# Patient Record
Sex: Female | Born: 1965 | Race: White | Hispanic: No | Marital: Married | Smoking: Former smoker
Health system: Southern US, Community
[De-identification: ages and names within clinical notes are randomized; demographics above are authoritative.]

## PROBLEM LIST (undated history)

## (undated) DIAGNOSIS — I2699 Other pulmonary embolism without acute cor pulmonale: Secondary | ICD-10-CM

## (undated) DIAGNOSIS — K219 Gastro-esophageal reflux disease without esophagitis: Secondary | ICD-10-CM

## (undated) DIAGNOSIS — E7800 Pure hypercholesterolemia, unspecified: Secondary | ICD-10-CM

## (undated) HISTORY — PX: CHOLECYSTECTOMY: SHX55

## (undated) HISTORY — PX: ABDOMINAL HYSTERECTOMY: SHX81

## (undated) HISTORY — PX: KNEE ARTHROPLASTY: SHX992

---

## 2015-10-11 DIAGNOSIS — K8020 Calculus of gallbladder without cholecystitis without obstruction: Secondary | ICD-10-CM | POA: Insufficient documentation

## 2015-10-11 DIAGNOSIS — R109 Unspecified abdominal pain: Secondary | ICD-10-CM | POA: Insufficient documentation

## 2015-10-11 HISTORY — DX: Calculus of gallbladder without cholecystitis without obstruction: K80.20

## 2015-10-11 HISTORY — DX: Unspecified abdominal pain: R10.9

## 2016-07-26 DIAGNOSIS — I2699 Other pulmonary embolism without acute cor pulmonale: Secondary | ICD-10-CM | POA: Diagnosis not present

## 2016-07-27 DIAGNOSIS — I2699 Other pulmonary embolism without acute cor pulmonale: Secondary | ICD-10-CM | POA: Diagnosis not present

## 2016-08-12 DIAGNOSIS — D509 Iron deficiency anemia, unspecified: Secondary | ICD-10-CM

## 2016-08-12 DIAGNOSIS — R7303 Prediabetes: Secondary | ICD-10-CM

## 2016-08-12 DIAGNOSIS — K219 Gastro-esophageal reflux disease without esophagitis: Secondary | ICD-10-CM | POA: Diagnosis present

## 2016-08-12 DIAGNOSIS — K449 Diaphragmatic hernia without obstruction or gangrene: Secondary | ICD-10-CM

## 2016-08-12 HISTORY — DX: Diaphragmatic hernia without obstruction or gangrene: K44.9

## 2016-08-12 HISTORY — DX: Iron deficiency anemia, unspecified: D50.9

## 2016-08-12 HISTORY — DX: Prediabetes: R73.03

## 2016-08-13 DIAGNOSIS — Z79899 Other long term (current) drug therapy: Secondary | ICD-10-CM

## 2016-08-13 DIAGNOSIS — E669 Obesity, unspecified: Secondary | ICD-10-CM | POA: Insufficient documentation

## 2016-08-13 DIAGNOSIS — R5381 Other malaise: Secondary | ICD-10-CM

## 2016-08-13 DIAGNOSIS — R5383 Other fatigue: Secondary | ICD-10-CM

## 2016-08-13 HISTORY — DX: Other long term (current) drug therapy: Z79.899

## 2016-08-13 HISTORY — DX: Other malaise: R53.81

## 2016-08-13 HISTORY — DX: Obesity, unspecified: E66.9

## 2016-08-13 HISTORY — DX: Other malaise: R53.83

## 2016-08-21 DIAGNOSIS — E538 Deficiency of other specified B group vitamins: Secondary | ICD-10-CM

## 2016-08-21 HISTORY — DX: Deficiency of other specified B group vitamins: E53.8

## 2016-09-27 DIAGNOSIS — I700 Atherosclerosis of aorta: Secondary | ICD-10-CM

## 2016-09-27 HISTORY — DX: Atherosclerosis of aorta: I70.0

## 2016-12-24 DIAGNOSIS — E785 Hyperlipidemia, unspecified: Secondary | ICD-10-CM

## 2016-12-24 HISTORY — DX: Hyperlipidemia, unspecified: E78.5

## 2019-08-26 DIAGNOSIS — S83241A Other tear of medial meniscus, current injury, right knee, initial encounter: Secondary | ICD-10-CM

## 2019-08-26 HISTORY — DX: Other tear of medial meniscus, current injury, right knee, initial encounter: S83.241A

## 2019-09-16 DIAGNOSIS — M1711 Unilateral primary osteoarthritis, right knee: Secondary | ICD-10-CM | POA: Insufficient documentation

## 2019-09-16 HISTORY — DX: Unilateral primary osteoarthritis, right knee: M17.11

## 2019-10-24 DIAGNOSIS — M47816 Spondylosis without myelopathy or radiculopathy, lumbar region: Secondary | ICD-10-CM

## 2019-10-24 DIAGNOSIS — M51369 Other intervertebral disc degeneration, lumbar region without mention of lumbar back pain or lower extremity pain: Secondary | ICD-10-CM

## 2019-10-24 DIAGNOSIS — M5136 Other intervertebral disc degeneration, lumbar region: Secondary | ICD-10-CM

## 2019-10-24 HISTORY — DX: Other intervertebral disc degeneration, lumbar region: M51.36

## 2019-10-24 HISTORY — DX: Spondylosis without myelopathy or radiculopathy, lumbar region: M47.816

## 2019-10-24 HISTORY — DX: Other intervertebral disc degeneration, lumbar region without mention of lumbar back pain or lower extremity pain: M51.369

## 2019-11-25 ENCOUNTER — Inpatient Hospital Stay (HOSPITAL_COMMUNITY)
Admission: AD | Admit: 2019-11-25 | Discharge: 2019-11-27 | DRG: 175 | Disposition: A | Discharge status: Home / Self Care | Payer: Commercial Managed Care - PPO | Source: Other Acute Inpatient Hospital | Attending: Internal Medicine | Admitting: Internal Medicine

## 2019-11-25 ENCOUNTER — Encounter (HOSPITAL_COMMUNITY): Payer: Self-pay | Admitting: Internal Medicine

## 2019-11-25 ENCOUNTER — Other Ambulatory Visit: Payer: Self-pay

## 2019-11-25 DIAGNOSIS — Z87891 Personal history of nicotine dependence: Secondary | ICD-10-CM

## 2019-11-25 DIAGNOSIS — G8929 Other chronic pain: Secondary | ICD-10-CM

## 2019-11-25 DIAGNOSIS — Z79899 Other long term (current) drug therapy: Secondary | ICD-10-CM

## 2019-11-25 DIAGNOSIS — J9601 Acute respiratory failure with hypoxia: Secondary | ICD-10-CM | POA: Diagnosis present

## 2019-11-25 DIAGNOSIS — Z7982 Long term (current) use of aspirin: Secondary | ICD-10-CM | POA: Diagnosis not present

## 2019-11-25 DIAGNOSIS — E7800 Pure hypercholesterolemia, unspecified: Secondary | ICD-10-CM | POA: Diagnosis present

## 2019-11-25 DIAGNOSIS — R7303 Prediabetes: Secondary | ICD-10-CM | POA: Diagnosis present

## 2019-11-25 DIAGNOSIS — Z86711 Personal history of pulmonary embolism: Secondary | ICD-10-CM

## 2019-11-25 DIAGNOSIS — I2699 Other pulmonary embolism without acute cor pulmonale: Principal | ICD-10-CM | POA: Diagnosis present

## 2019-11-25 DIAGNOSIS — Z20822 Contact with and (suspected) exposure to covid-19: Secondary | ICD-10-CM | POA: Diagnosis present

## 2019-11-25 DIAGNOSIS — K219 Gastro-esophageal reflux disease without esophagitis: Secondary | ICD-10-CM | POA: Diagnosis present

## 2019-11-25 DIAGNOSIS — I361 Nonrheumatic tricuspid (valve) insufficiency: Secondary | ICD-10-CM | POA: Diagnosis not present

## 2019-11-25 DIAGNOSIS — E785 Hyperlipidemia, unspecified: Secondary | ICD-10-CM | POA: Diagnosis not present

## 2019-11-25 DIAGNOSIS — Z90710 Acquired absence of both cervix and uterus: Secondary | ICD-10-CM

## 2019-11-25 DIAGNOSIS — Z96659 Presence of unspecified artificial knee joint: Secondary | ICD-10-CM | POA: Diagnosis present

## 2019-11-25 DIAGNOSIS — I2609 Other pulmonary embolism with acute cor pulmonale: Secondary | ICD-10-CM | POA: Diagnosis not present

## 2019-11-25 HISTORY — DX: Morbid (severe) obesity due to excess calories: E66.01

## 2019-11-25 HISTORY — DX: Pure hypercholesterolemia, unspecified: E78.00

## 2019-11-25 HISTORY — DX: Gastro-esophageal reflux disease without esophagitis: K21.9

## 2019-11-25 HISTORY — DX: Other chronic pain: G89.29

## 2019-11-25 HISTORY — DX: Other pulmonary embolism without acute cor pulmonale: I26.99

## 2019-11-25 LAB — HIV ANTIBODY (ROUTINE TESTING W REFLEX): HIV Screen 4th Generation wRfx: NONREACTIVE

## 2019-11-25 LAB — MRSA PCR SCREENING: MRSA by PCR: NEGATIVE

## 2019-11-25 LAB — HEPARIN LEVEL (UNFRACTIONATED): Heparin Unfractionated: 0.6 IU/mL (ref 0.30–0.70)

## 2019-11-25 MED ORDER — HEPARIN (PORCINE) 25000 UT/250ML-% IV SOLN
1400.0000 [IU]/h | INTRAVENOUS | Status: DC
  Administered 2019-11-26 – 2019-11-27 (×2): 1400 [IU]/h via INTRAVENOUS
  Filled 2019-11-25 (×2): qty 250

## 2019-11-25 MED ORDER — MORPHINE SULFATE (PF) 2 MG/ML IV SOLN: 2.0000 mg | INTRAVENOUS | Status: DC | PRN

## 2019-11-25 MED ORDER — ONDANSETRON HCL 4 MG PO TABS: 4.0000 mg | ORAL_TABLET | Freq: Four times a day (QID) | ORAL | Status: DC | PRN

## 2019-11-25 MED ORDER — SODIUM CHLORIDE 0.9 % IV SOLN: INTRAVENOUS | Status: DC

## 2019-11-25 MED ORDER — ONDANSETRON HCL 4 MG/2ML IJ SOLN: 4.0000 mg | Freq: Four times a day (QID) | INTRAMUSCULAR | Status: DC | PRN

## 2019-11-25 NOTE — Progress Notes (Addendum)
ANTICOAGULATION CONSULT NOTE  Pharmacy Consult for heparin Indication: pulmonary embolus  Heparin Dosing Weight: 88.3 kg  Labs: No results for input(s): HGB, HCT, PLT, APTT, LABPROT, INR, HEPARINUNFRC, HEPRLOWMOCWT, CREATININE, CKTOTAL, CKMB, TROPONINI in the last 72 hours.  Assessment: 27 yof presenting to [facility] with SOB since ~4 days after her R knee arthroscopic surgery on 2/11, found to have acute PE. Patient does have a history of PEs and was on anticoagulation; however, she stated at her office visit today that her doctor took her off years ago. Patient is not currently on anticoagulation PTA. Pharmacy consulted to dose heparin. Labs pending on admit. No active bleed issues reported.  Patient transferred from [facility] on heparin drip at 1400 units/hr. Received 5000 unit bolus and drip was started at 1332 at [place] per discussion with RN.   Goal of Therapy:  Heparin level 0.3-0.7 units/ml Monitor platelets by anticoagulation protocol: Yes   Plan:  Check Stat heparin level and adjust rate as appropriate Monitor daily CBC, s/sx bleeding   Babs Bertin, PharmD, BCPS Clinical Pharmacist 11/25/2019 8:23 PM    ADDENDUM - Initial heparin level therapeutic at 0.6 on heparin at 1400 units/hr. Will continue at current rate and f/u confirmatory heparin level in 6 hours.   Babs Bertin, PharmD, BCPS Please check AMION for all [facility] Pharmacy contact numbers Clinical Pharmacist 11/25/2019 10:28 PM

## 2019-11-25 NOTE — H&P (Signed)
History and Physical   Meagan Doyle NWG:[id number] DOB: May 02, 1966 DOA: 11/25/2019  Referring MD/NP/PA: From [facility]  PCP: No primary care provider on file.    Patient coming from: [facility]  Chief Complaint: Shortness of breath  HPI: Meagan Doyle is a 54 y.o. female with medical history significant of previous pulmonary embolism as a result of orthopedics surgery about 5 years ago, GERD, hyperlipidemia, prediabetes, morbid obesity, who recently had right knee arthroscopy on February 11 and presented at [facility] yesterday with shortness of breath and cough.  She initially went to her PCP who noted the oxygen sat of 89% on room air.  She has completed anticoagulation for 6 months 5 years ago and has not been on any.  Patient did not have any hypercoagulable work-up afterwards.  She has been fine until now.  But in the past 2 weeks she has dyspnea on exertion bilateral lower extremity edema for several days.  In the last 2 days she has been more short of breath with any minimal activity.  She was seen and evaluated at [facility].  Patient found to have bilateral PE with right heart strain.  Suspected submassive.  Transferred here for evaluation.  She was started on heparin.  She is hemodynamically stable.  Lab appears to be stable.  EKG showed right axis deviation and troponin was 0.11 patient therefore transferred here for work-up..  Review of Systems: As per HPI otherwise 10 point review of systems negative.    Past Medical History:  Diagnosis Date  . GERD (gastroesophageal reflux disease)   . High blood cholesterol   . Pulmonary embolism [facility])     Past Surgical History:  Procedure Laterality Date  . ABDOMINAL HYSTERECTOMY    . CHOLECYSTECTOMY    . KNEE ARTHROPLASTY       reports that she has quit smoking. Her smoking use included cigarettes. She quit after 15.00 years of use. She has never used smokeless tobacco. She reports previous alcohol use.  She reports that she does not use drugs.  No Known Allergies  History reviewed. No pertinent family history.   Prior to Admission medications   Not on File    Physical Exam: Vitals:   11/25/19 1945 11/25/19 2328  BP: (!) 144/87 131/78  Pulse: 81 89  Resp: 19 16  Temp: 98 F (36.7 C) 97.6 F (36.4 C)  TempSrc: Oral Oral  SpO2: 97% 98%  Weight: 108 kg   Height: 5\' 8"  (1.727 m)       Constitutional: Anxious no acute distress Vitals:   11/25/19 1945 11/25/19 2328  BP: (!) 144/87 131/78  Pulse: 81 89  Resp: 19 16  Temp: 98 F (36.7 C) 97.6 F (36.4 C)  TempSrc: Oral Oral  SpO2: 97% 98%  Weight: 108 kg   Height: 5\' 8"  (1.727 m)    Eyes: PERRL, lids and conjunctivae normal ENMT: Mucous membranes are moist. Posterior pharynx clear of any exudate or lesions.Normal dentition.  Neck: normal, supple, no masses, no thyromegaly Respiratory: Coarse breath sounds, clear to auscultation bilaterally, no wheezing, no crackles. Normal respiratory effort. No accessory muscle use.  Cardiovascular: Regular rate and rhythm, no murmurs / rubs / gallops. No extremity edema. 2+ pedal pulses. No carotid bruits.  Abdomen: no tenderness, no masses palpated. No hepatosplenomegaly. Bowel sounds positive.  Musculoskeletal: no clubbing / cyanosis. No joint deformity upper and lower extremities. Good ROM, no contractures. Normal muscle tone.  Skin: no rashes, lesions, ulcers. No induration Neurologic:  CN 2-12 grossly intact. Sensation intact, DTR normal. Strength 5/5 in all 4.  Psychiatric: Normal judgment and insight. Alert and oriented x 3. Normal mood.     Labs on Admission: I have personally reviewed following labs and imaging studies  CBC: No results for input(s): WBC, NEUTROABS, HGB, HCT, MCV, PLT in the last 168 hours. Basic Metabolic Panel: No results for input(s): NA, K, CL, CO2, GLUCOSE, BUN, CREATININE, CALCIUM, MG, PHOS in the last 168 hours. GFR: CrCl cannot be calculated (No  successful lab value found.). Liver Function Tests: No results for input(s): AST, ALT, ALKPHOS, BILITOT, PROT, ALBUMIN in the last 168 hours. No results for input(s): LIPASE, AMYLASE in the last 168 hours. No results for input(s): AMMONIA in the last 168 hours. Coagulation Profile: No results for input(s): INR, PROTIME in the last 168 hours. Cardiac Enzymes: No results for input(s): CKTOTAL, CKMB, CKMBINDEX, TROPONINI in the last 168 hours. BNP (last 3 results) No results for input(s): PROBNP in the last 8760 hours. HbA1C: No results for input(s): HGBA1C in the last 72 hours. CBG: No results for input(s): GLUCAP in the last 168 hours. Lipid Profile: No results for input(s): CHOL, HDL, LDLCALC, TRIG, CHOLHDL, LDLDIRECT in the last 72 hours. Thyroid Function Tests: No results for input(s): TSH, T4TOTAL, FREET4, T3FREE, THYROIDAB in the last 72 hours. Anemia Panel: No results for input(s): VITAMINB12, FOLATE, FERRITIN, TIBC, IRON, RETICCTPCT in the last 72 hours. Urine analysis: No results found for: COLORURINE, APPEARANCEUR, LABSPEC, PHURINE, GLUCOSEU, HGBUR, BILIRUBINUR, KETONESUR, PROTEINUR, UROBILINOGEN, NITRITE, LEUKOCYTESUR Sepsis Labs: @LABRCNTIP (procalcitonin:4,lacticidven:4) ) Recent Results (from the past 240 hour(s))  MRSA PCR Screening     Status: None   Collection Time: 11/25/19  7:40 PM   Specimen: Nasal Mucosa; Nasopharyngeal  Result Value Ref Range Status   MRSA by PCR NEGATIVE NEGATIVE Final    Comment:        The GeneXpert MRSA Assay (FDA approved for NASAL specimens only), is one component of a comprehensive MRSA colonization surveillance program. It is not intended to diagnose MRSA infection nor to guide or monitor treatment for MRSA infections. Performed at [facility], [address], [place], [place]      Radiological Exams on Admission: No results found.  EKG: Independently reviewed.  Sinus rhythm with no significant  changes  Assessment/Plan Principal Problem:   Pulmonary emboli (HCC) Active Problems:   Hyperlipemia   Chronic pain   GERD (gastroesophageal reflux disease)   Morbid obesity (HCC)   Pulmonary embolism and infarction (HCC)     #1 submassive pulmonary embolism with right heart strain: Patient currently on heparin.  Bedrest.  Stat echocardiogram in the morning.  May require vascular consultation if right heart strain persist.  She might be a candidate for embolectomy.  Counseling provided.  Patient most likely will remain on anticoagulation for the rest of her life.  She has taken Xarelto before.  2 hyperlipidemia: Currently on no medications at home.  May not be started on another 1.  #3 GERD: May require PPIs.  #4 morbid obesity: Dietary counseling.  #5 chronic pain: Not on any medication at the moment.   DVT prophylaxis: Heparin drip Code Status: Full code Family Communication: Discussed care with patient Disposition Plan: Home Consults called: None at the moment Admission status: Inpatient  Severity of Illness: The appropriate patient status for this patient is INPATIENT. Inpatient status is judged to be reasonable and necessary in order to provide the required intensity of service to ensure  the patient's safety. The patient's presenting symptoms, physical exam findings, and initial radiographic and laboratory data in the context of their chronic comorbidities is felt to place them at high risk for further clinical deterioration. Furthermore, it is not anticipated that the patient will be medically stable for discharge from the hospital within 2 midnights of admission. The following factors support the patient status of inpatient.   " The patient's presenting symptoms include shortness of breath. " The worrisome physical exam findings include no significant finding on exam. " The initial radiographic and laboratory data are worrisome because of CT findings of submassive  PE. " The chronic co-morbidities include previous PE.   * I certify that at the point of admission it is my clinical judgment that the patient will require inpatient hospital care spanning beyond 2 midnights from the point of admission due to high intensity of service, high risk for further deterioration and high frequency of surveillance required.Barbette Merino MD [facility] Pager [phone]  If 7PM-7AM, please contact night-coverage [email or web address] Password [facility]  11/26/2019, 12:36 AM

## 2019-11-26 ENCOUNTER — Inpatient Hospital Stay (HOSPITAL_COMMUNITY): Payer: Commercial Managed Care - PPO

## 2019-11-26 ENCOUNTER — Encounter (HOSPITAL_COMMUNITY): Payer: Self-pay | Admitting: Internal Medicine

## 2019-11-26 DIAGNOSIS — I2609 Other pulmonary embolism with acute cor pulmonale: Secondary | ICD-10-CM

## 2019-11-26 DIAGNOSIS — I2699 Other pulmonary embolism without acute cor pulmonale: Secondary | ICD-10-CM

## 2019-11-26 LAB — CBC
HCT: 36.6 % (ref 36.0–46.0)
Hemoglobin: 11.8 g/dL — ABNORMAL LOW (ref 12.0–15.0)
MCH: 28.6 pg (ref 26.0–34.0)
MCHC: 32.2 g/dL (ref 30.0–36.0)
MCV: 88.6 fL (ref 80.0–100.0)
Platelets: 206 10*3/uL (ref 150–400)
RBC: 4.13 MIL/uL (ref 3.87–5.11)
RDW: 14.1 % (ref 11.5–15.5)
WBC: 4.4 10*3/uL (ref 4.0–10.5)
nRBC: 0 % (ref 0.0–0.2)

## 2019-11-26 LAB — COMPREHENSIVE METABOLIC PANEL
ALT: 14 U/L (ref 0–44)
AST: 13 U/L — ABNORMAL LOW (ref 15–41)
Albumin: 3.4 g/dL — ABNORMAL LOW (ref 3.5–5.0)
Alkaline Phosphatase: 91 U/L (ref 38–126)
Anion gap: 9 (ref 5–15)
BUN: 7 mg/dL (ref 6–20)
CO2: 26 mmol/L (ref 22–32)
Calcium: 8.5 mg/dL — ABNORMAL LOW (ref 8.9–10.3)
Chloride: 106 mmol/L (ref 98–111)
Creatinine, Ser: 0.64 mg/dL (ref 0.44–1.00)
GFR calc Af Amer: >60 mL/min (ref >60)
GFR calc non Af Amer: >60 mL/min (ref >60)
Glucose, Bld: 95 mg/dL (ref 70–99)
Potassium: 3 mmol/L — ABNORMAL LOW (ref 3.5–5.1)
Sodium: 141 mmol/L (ref 135–145)
Total Bilirubin: 1 mg/dL (ref 0.3–1.2)
Total Protein: 6 g/dL — ABNORMAL LOW (ref 6.5–8.1)

## 2019-11-26 LAB — HEPARIN LEVEL (UNFRACTIONATED): Heparin Unfractionated: 0.45 IU/mL (ref 0.30–0.70)

## 2019-11-26 MED ORDER — ORAL CARE MOUTH RINSE
15.0000 mL | Freq: Two times a day (BID) | OROMUCOSAL | Status: DC
  Administered 2019-11-26: 15 mL via OROMUCOSAL

## 2019-11-26 MED ORDER — ACETAMINOPHEN 325 MG PO TABS
650.0000 mg | ORAL_TABLET | Freq: Four times a day (QID) | ORAL | Status: DC | PRN
  Administered 2019-11-26 (×2): 650 mg via ORAL
  Filled 2019-11-26 (×2): qty 2

## 2019-11-26 MED ORDER — POTASSIUM CHLORIDE CRYS ER 20 MEQ PO TBCR
40.0000 meq | EXTENDED_RELEASE_TABLET | ORAL | Status: AC
  Administered 2019-11-26 (×2): 40 meq via ORAL
  Filled 2019-11-26 (×2): qty 2

## 2019-11-26 NOTE — Progress Notes (Addendum)
PROGRESS NOTE    Meagan Doyle  ZGY:[id number] DOB: 1966/06/21 DOA: 11/25/2019 PCP: No primary care provider on file.     Brief Narrative:  Meagan Doyle is a 54 y.o. female with medical history significant of previous pulmonary embolism as a result of orthopedics surgery about 5 years ago, GERD, hyperlipidemia, prediabetes, morbid obesity, who recently had right knee arthroscopy on February 11 and presented at [facility] yesterday with shortness of breath and cough.  She initially went to her PCP who noted the oxygen sat of 89% on room air.  She had completed anticoagulation for 6 months 5 years ago and has not been on any.  Patient did not have any hypercoagulable work-up afterwards. In the past 2 weeks, she has dyspnea on exertion, bilateral lower extremity edema for several days.  In the last 2 days she has been more short of breath with any minimal activity.  She was seen and evaluated at [facility].  Patient found to have bilateral PE with right heart strain, started on heparin.  Addendum: Records from [place] reviewed   CXR: Cardiomegaly, no pulmonary venous congestion, no acute pulmonary disease  CTA chest: Acute bilateral pulmonary emboli, positive for acute PE with CT evidence of right heart strain (RV/LV ratio = 1.04) consistent with at least submassive (intermediate risk) PE.  Echocardiogram: Technically difficult study with suboptimal views.  Patient sitting straight up secondary to shortness of breath.  Grossly normal LV size and systolic function.  Unable to comment on regional wall motion.  Diastolic filling pattern indicates impaired relaxation.  RV was not well visualized.  Left atrium normal in size.  Mild to moderate tricuspid regurgitation present.  RVSP 44 mmHg.   New events last 24 hours / Subjective: Continues to have some shortness of breath.  Denies any chest pain.  Endorses some peripheral edema as well without calf tenderness.  Assessment & Plan:     Principal Problem:   Pulmonary emboli (HCC) Active Problems:   Hyperlipemia   Chronic pain   GERD (gastroesophageal reflux disease)   Morbid obesity (HCC)   Pulmonary embolism and infarction (HCC)    Submassive PE with right heart strain and DVT on right  -Continue IV heparin -Echocardiogram from [place] as above  -DVT Doppler ultrasound +Right DVT  Acute hypoxemic respiratory failure -Requiring nasal cannula O2 at 2 L.  Continue to wean as able to maintain sat >92%  Hyperlipidemia -Not on any medications as an outpatient.  Check lipid panel  Hypokalemia -Replace, trend   DVT prophylaxis: IV heparin Code Status: Full code Family Communication: None at bedside Disposition Plan:  . Patient is from home prior to admission. . Currently in-hospital treatment needed due to IV heparin for submassive PE . Suspect patient will discharge home in 1 to 2 days   Consultants:   None  Procedures:   None  Antimicrobials:  Anti-infectives (From admission, onward)   None        Objective: Vitals:   11/25/19 1945 11/25/19 2328 11/26/19 0343 11/26/19 0809  BP: (!) 144/87 131/78 121/76 (!) 130/93  Pulse: 81 89 75 83  Resp: 19 16 18 15   Temp: 98 F (36.7 C) 97.6 F (36.4 C) 97.7 F (36.5 C) (!) 97.5 F (36.4 C)  TempSrc: Oral Oral Oral Oral  SpO2: 97% 98% 99% 99%  Weight: 108 kg     Height: 5\' 8"  (1.727 m)       Intake/Output Summary (Last 24 hours) at 11/26/2019 1015 Last data  filed at 11/26/2019 0600 Gross per 24 hour  Intake 1134.28 ml  Output --  Net 1134.28 ml   Filed Weights   11/25/19 1945  Weight: 108 kg    Examination:  General exam: Appears calm and comfortable  Respiratory system: Clear to auscultation. Respiratory effort normal. No respiratory distress.  Mild conversational dyspnea.  Cardiovascular system: S1 & S2 heard, RRR. No murmurs.  Trace bilateral pedal edema. Gastrointestinal system: Abdomen is nondistended, soft and nontender. Normal  bowel sounds heard. Central nervous system: Alert and oriented. No focal neurological deficits. Speech clear.  Extremities: Symmetric in appearance  Skin: No rashes, lesions or ulcers on exposed skin  Psychiatry: Judgement and insight appear normal. Mood & affect appropriate.   Data Reviewed: I have personally reviewed following labs and imaging studies  CBC: Recent Labs  Lab 11/26/19 0231  WBC 4.4  HGB 11.8*  HCT 36.6  MCV 88.6  PLT 875   Basic Metabolic Panel: Recent Labs  Lab 11/26/19 0231  NA 141  K 3.0*  CL 106  CO2 26  GLUCOSE 95  BUN 7  CREATININE 0.64  CALCIUM 8.5*   GFR: Estimated Creatinine Clearance: 104.6 mL/min (by C-G formula based on SCr of 0.64 mg/dL). Liver Function Tests: Recent Labs  Lab 11/26/19 0231  AST 13*  ALT 14  ALKPHOS 91  BILITOT 1.0  PROT 6.0*  ALBUMIN 3.4*   No results for input(s): LIPASE, AMYLASE in the last 168 hours. No results for input(s): AMMONIA in the last 168 hours. Coagulation Profile: No results for input(s): INR, PROTIME in the last 168 hours. Cardiac Enzymes: No results for input(s): CKTOTAL, CKMB, CKMBINDEX, TROPONINI in the last 168 hours. BNP (last 3 results) No results for input(s): PROBNP in the last 8760 hours. HbA1C: No results for input(s): HGBA1C in the last 72 hours. CBG: No results for input(s): GLUCAP in the last 168 hours. Lipid Profile: No results for input(s): CHOL, HDL, LDLCALC, TRIG, CHOLHDL, LDLDIRECT in the last 72 hours. Thyroid Function Tests: No results for input(s): TSH, T4TOTAL, FREET4, T3FREE, THYROIDAB in the last 72 hours. Anemia Panel: No results for input(s): VITAMINB12, FOLATE, FERRITIN, TIBC, IRON, RETICCTPCT in the last 72 hours. Sepsis Labs: No results for input(s): PROCALCITON, LATICACIDVEN in the last 168 hours.  Recent Results (from the past 240 hour(s))  MRSA PCR Screening     Status: None   Collection Time: 11/25/19  7:40 PM   Specimen: Nasal Mucosa; Nasopharyngeal    Result Value Ref Range Status   MRSA by PCR NEGATIVE NEGATIVE Final    Comment:        The GeneXpert MRSA Assay (FDA approved for NASAL specimens only), is one component of a comprehensive MRSA colonization surveillance program. It is not intended to diagnose MRSA infection nor to guide or monitor treatment for MRSA infections. Performed at [facility], [place] [address], [place], [place] [address]       Radiology Studies: No results found.    Scheduled Meds: . mouth rinse  15 mL Mouth Rinse BID  . potassium chloride  40 mEq Oral Q4H   Continuous Infusions: . sodium chloride 125 mL/hr at 11/26/19 0517  . heparin 1,400 Units/hr (11/26/19 0921)     LOS: 1 day      Time spent: 35 minutes   Dessa Phi, DO [facility] 11/26/2019, 10:15 AM   Available via Epic secure chat 7am-7pm After these hours, please refer to coverage provider listed on [email or web address]

## 2019-11-26 NOTE — Plan of Care (Signed)

## 2019-11-26 NOTE — Progress Notes (Signed)
LEV  has been completed.   Preliminary results in CV Proc.  Results given to RN.   Blanch Media 11/26/2019 10:58 AM

## 2019-11-26 NOTE — Progress Notes (Signed)
ANTICOAGULATION CONSULT NOTE  Pharmacy Consult for heparin Indication: pulmonary embolus  Heparin Dosing Weight: 88.3 kg  Labs: Recent Labs    11/25/19 2149 11/26/19 0231  HGB  --  11.8*  HCT  --  36.6  PLT  --  206  HEPARINUNFRC 0.60 0.45  CREATININE  --  0.64    Assessment: 54 yof presenting to [facility] with SOB since ~4 days after her R knee arthroscopic surgery on 2/11, found to have acute PE, now transferred to [facility]. Patient does have a history of PEs and was on anticoagulation; however, she stated at her office visit today that her doctor took her off years ago. Patient is not currently on anticoagulation PTA. Pharmacy consulted to dose heparin.  Heparin level this AM therapeutic at 0.45. Hgb 11.8, pltc 206. No bleeding reported   Goal of Therapy:  Heparin level 0.3-0.7 units/ml Monitor platelets by anticoagulation protocol: Yes   Plan:  Continue heparin IV at 1400 units/hr Daily heparin level, CBC Monitor for bleeding  Danae Orleans, PharmD, [facility] PGY2 Cardiology Pharmacy Resident Phone [phone] 11/26/2019       7:30 AM  Please check [email or web address] for unit-specific pharmacist phone numbers

## 2019-11-26 NOTE — Procedures (Signed)
Patient has results from recent echo 2/26 at [facility] in chart.   Please contact echo department if echo needs to be repeated.

## 2019-11-26 NOTE — Evaluation (Addendum)
Physical Therapy Evaluation Patient Details Name: Meagan Doyle MRN: [id number] DOB: 21-Nov-1965 Today's Date: 11/26/2019   History of Present Illness  Pt is a 54 y.o. F with significant PMH of PE and recent right knee arthroscopy 11/09/25 who presents wtih shortness of breath and cough. CTA chest positive for acute bilateral pulmonary emboli, also has RLE DVT.   Clinical Impression  Pt admitted with above. Presents with decreased functional mobility secondary to RLE weakness, decreased ROM, and decreased cardiopulmonary endurance. Pt ambulating 200 feet with two standing rest breaks; desaturation to 84% on RA, rebounded > 90% on 2L O2, HR peak 130 bpm. Education provided regarding pursed lip breathing, energy conservation techniques, activity recommendations/progression. Will continue to progress as tolerated.     Follow Up Recommendations Outpatient PT;Supervision for mobility/OOB (pt already active with [facility])    Equipment Recommendations  None recommended by PT    Recommendations for Other Services       Precautions / Restrictions Precautions Precautions: Other (comment) Precaution Comments: watch O2 sats Restrictions Weight Bearing Restrictions: No      Mobility  Bed Mobility Overal bed mobility: Modified Independent                Transfers Overall transfer level: Needs assistance Equipment used: None Transfers: Sit to/from Stand Sit to Stand: Supervision            Ambulation/Gait Ambulation/Gait assistance: Min guard Gait Distance (Feet): 200 Feet Assistive device: None Gait Pattern/deviations: Step-through pattern;Decreased weight shift to right;Antalgic;Decreased stance time - right Gait velocity: decreased   General Gait Details: Pt with antalgic gait pattern but no gross unsteadiness.  Stairs            Wheelchair Mobility    Modified Rankin (Stroke Patients Only)       Balance Overall balance assessment: Needs  assistance   Sitting balance-Leahy Scale: Good       Standing balance-Leahy Scale: Good                               Pertinent Vitals/Pain Pain Assessment: No/denies pain    Home Living Family/patient expects to be discharged to:: Private residence Living Arrangements: Spouse/significant other Available Help at Discharge: Family Type of Home: House Home Access: Stairs to enter   Secretary/administrator of Steps: 2 Home Layout: One level Home Equipment: Crutches      Prior Function Level of Independence: Independent               Hand Dominance        Extremity/Trunk Assessment   Upper Extremity Assessment Upper Extremity Assessment: Overall WFL for tasks assessed    Lower Extremity Assessment Lower Extremity Assessment: RLE deficits/detail RLE Deficits / Details: Recent R knee arthroscopy, supine knee flexion > 90    Cervical / Trunk Assessment Cervical / Trunk Assessment: Normal  Communication   Communication: No difficulties  Cognition Arousal/Alertness: Awake/alert Behavior During Therapy: WFL for tasks assessed/performed Overall Cognitive Status: Within Functional Limits for tasks assessed                                        General Comments      Exercises Other Exercises Other Exercises: Encouraged supine RLE exercises i.e. heel slides, hip flexion/abd, quad sets   Assessment/Plan    PT Assessment Patient needs continued  PT services  PT Problem List Decreased strength;Decreased activity tolerance;Decreased balance;Decreased mobility;Cardiopulmonary status limiting activity       PT Treatment Interventions Gait training;Stair training;Functional mobility training;Therapeutic activities;Therapeutic exercise;Balance training;Patient/family education    PT Goals (Current goals can be found in the Care Plan section)  Acute Rehab PT Goals Patient Stated Goal: get stronger PT Goal Formulation: With  patient Time For Goal Achievement: 12/10/19 Potential to Achieve Goals: Good    Frequency Min 3X/week   Barriers to discharge        Co-evaluation               AM-PAC PT "6 Clicks" Mobility  Outcome Measure Help needed turning from your back to your side while in a flat bed without using bedrails?: None Help needed moving from lying on your back to sitting on the side of a flat bed without using bedrails?: None Help needed moving to and from a bed to a chair (including a wheelchair)?: None Help needed standing up from a chair using your arms (e.g., wheelchair or bedside chair)?: None Help needed to walk in hospital room?: A Little Help needed climbing 3-5 steps with a railing? : A Little 6 Click Score: 22    End of Session Equipment Utilized During Treatment: Oxygen Activity Tolerance: Patient tolerated treatment well Patient left: in bed;with call bell/phone within reach;with family/visitor present Nurse Communication: Mobility status PT Visit Diagnosis: Difficulty in walking, not elsewhere classified (R26.2);Other abnormalities of gait and mobility (R26.89)    Time: 2878-6767 PT Time Calculation (min) (ACUTE ONLY): 18 min   Charges:   PT Evaluation $PT Eval Moderate Complexity: 1 Mod            Wyona Almas, PT, DPT Acute Rehabilitation Services Pager [phone] Office [phone]   Deno Etienne 11/26/2019, 5:28 PM

## 2019-11-27 LAB — CBC
HCT: 36.5 % (ref 36.0–46.0)
Hemoglobin: 11.9 g/dL — ABNORMAL LOW (ref 12.0–15.0)
MCH: 29.2 pg (ref 26.0–34.0)
MCHC: 32.6 g/dL (ref 30.0–36.0)
MCV: 89.5 fL (ref 80.0–100.0)
Platelets: 219 10*3/uL (ref 150–400)
RBC: 4.08 MIL/uL (ref 3.87–5.11)
RDW: 14.4 % (ref 11.5–15.5)
WBC: 4.7 10*3/uL (ref 4.0–10.5)
nRBC: 0 % (ref 0.0–0.2)

## 2019-11-27 LAB — BASIC METABOLIC PANEL
Anion gap: 5 (ref 5–15)
BUN: 11 mg/dL (ref 6–20)
CO2: 25 mmol/L (ref 22–32)
Calcium: 8.7 mg/dL — ABNORMAL LOW (ref 8.9–10.3)
Chloride: 110 mmol/L (ref 98–111)
Creatinine, Ser: 0.61 mg/dL (ref 0.44–1.00)
GFR calc Af Amer: >60 mL/min (ref >60)
GFR calc non Af Amer: >60 mL/min (ref >60)
Glucose, Bld: 133 mg/dL — ABNORMAL HIGH (ref 70–99)
Potassium: 3.9 mmol/L (ref 3.5–5.1)
Sodium: 140 mmol/L (ref 135–145)

## 2019-11-27 LAB — LIPID PANEL
Cholesterol: 179 mg/dL (ref 0–200)
HDL: 50 mg/dL (ref >40)
LDL Cholesterol: 111 mg/dL — ABNORMAL HIGH (ref 0–99)
Total CHOL/HDL Ratio: 3.6 RATIO
Triglycerides: 88 mg/dL (ref <150)
VLDL: 18 mg/dL (ref 0–40)

## 2019-11-27 LAB — HEPARIN LEVEL (UNFRACTIONATED): Heparin Unfractionated: 0.45 IU/mL (ref 0.30–0.70)

## 2019-11-27 LAB — MAGNESIUM: Magnesium: 1.9 mg/dL (ref 1.7–2.4)

## 2019-11-27 MED ORDER — RIVAROXABAN 15 MG PO TABS
15.0000 mg | ORAL_TABLET | Freq: Two times a day (BID) | ORAL | Status: DC
  Administered 2019-11-27: 15 mg via ORAL
  Filled 2019-11-27 (×2): qty 1

## 2019-11-27 MED ORDER — RIVAROXABAN (XARELTO) VTE STARTER PACK (15 & 20 MG): ORAL_TABLET | ORAL | 0 refills | Status: DC

## 2019-11-27 MED ORDER — RIVAROXABAN 20 MG PO TABS: 20.0000 mg | ORAL_TABLET | Freq: Every day | ORAL | Status: DC

## 2019-11-27 NOTE — TOC Transition Note (Signed)
Transition of Care [facility]) - CM/SW Discharge Note   Patient Details  Name: Meagan Doyle MRN: [id number] Date of Birth: 22-May-1966  [facility] CM/SW Contact:  Lawerance Sabal, RN Phone Number: 11/27/2019, 9:38 AM   Clinical Narrative:    Patient provided with Lesia Hausen 30 day free and copay reduction cards. She verbalized understanding of use. Oxygen set up with [employer]. Patient states that he spouse and son will home to accept delivery and spouse can bring portable tank with him to come pick her up from the hospital. She is calling him to let him know. No other CM needs identified.     Final next level of care: Home/Self Care Barriers to Discharge: No Barriers Identified   Patient Goals and CMS Choice        Discharge Placement                       Discharge Plan and Services                DME Arranged: Oxygen DME Agency: [facility] Date DME Agency Contacted: 11/27/19 Time DME Agency Contacted: [phone] Representative spoke with at DME Agency: Verlon Au            Social Determinants of Health (SDOH) Interventions     Readmission Risk Interventions No flowsheet data found.

## 2019-11-27 NOTE — Discharge Summary (Signed)
Physician Discharge Summary  Riyanshi Wahab JHE:[id number] DOB: 02/05/1966 DOA: 11/25/2019  PCP: No primary care provider on file.  Admit date: 11/25/2019 Discharge date: 11/27/2019  Admitted From: Home Disposition:  Home  Recommendations for Outpatient Follow-up:  1. Follow up with PCP in 1 week 2. Outpatient referral placed for PT   Discharge Condition: Stable CODE STATUS: Full  Diet recommendation: Heart healthy   Brief/Interim Summary: Brandan Glauber Hauseris a 54 y.o.femalewith medical history significant ofprevious pulmonary embolism as a result of orthopedics surgery about 5 years ago, GERD, hyperlipidemia, prediabetes, morbid obesity, who recently had right knee arthroscopy on February 11 and presented at [facility] yesterday with shortness of breath and cough. She initially went to her PCP who noted the oxygen sat of 89% on room air. She had completed anticoagulation for 6 months 5 years ago and has not been on any. Patient did not have any hypercoagulable work-up afterwards. In the past 2 weeks, she has dyspnea on exertion, bilateral lower extremity edema for several days. In the last 2 days she has been more short of breath with any minimal activity. She was seen and evaluated at [facility]. Patient found to have bilateral PE with right heart strain, started on heparin.  Records from [facility] reviewed:  CXR: Cardiomegaly, no pulmonary venous congestion, no acute pulmonary disease  CTA chest: Acute bilateral pulmonary emboli, positive for acute PE with CT evidence of right heart strain (RV/LV ratio = 1.04) consistent with at least submassive (intermediate risk) PE.  Echocardiogram: Technically difficult study with suboptimal views.  Patient sitting straight up secondary to shortness of breath.  Grossly normal LV size and systolic function.  Unable to comment on regional wall motion.  Diastolic filling pattern indicates impaired relaxation.  RV was not well  visualized.  Left atrium normal in size.  Mild to moderate tricuspid regurgitation present.  RVSP 44 mmHg.   Discharge Diagnoses:  Principal Problem:   Pulmonary emboli (HCC) Active Problems:   Hyperlipemia   Chronic pain   GERD (gastroesophageal reflux disease)   Morbid obesity (HCC)   Pulmonary embolism and infarction (HCC)   Submassive PE with right heart strain and DVT on right  -IV heparin --> Xarelto on dc  -Echocardiogram from [place] as above  -DVT Doppler ultrasound +Right DVT -Remains hemodynamically stable at this time   Acute hypoxemic respiratory failure -Required nasal cannula O2 at 2 L -Now weaned down and on room air   Hyperlipidemia -Atorvastatin     Discharge Instructions  Discharge Instructions    Ambulatory referral to Physical Therapy   Complete by: As directed    Call MD for:   Complete by: As directed    Any sign of bleeding   Call MD for:  difficulty breathing, headache or visual disturbances   Complete by: As directed    Call MD for:  extreme fatigue   Complete by: As directed    Call MD for:  persistant dizziness or light-headedness   Complete by: As directed    Call MD for:  persistant nausea and vomiting   Complete by: As directed    Call MD for:  severe uncontrolled pain   Complete by: As directed    Call MD for:  temperature >100.4   Complete by: As directed    Diet - low sodium heart healthy   Complete by: As directed    Discharge instructions   Complete by: As directed    You were cared for by a hospitalist during  your hospital stay. If you have any questions about your discharge medications or the care you received while you were in the hospital after you are discharged, you can call the unit and ask to speak with the hospitalist on call if the hospitalist that took care of you is not available. Once you are discharged, your primary care physician will handle any further medical issues. Please note that NO REFILLS for any  discharge medications will be authorized once you are discharged, as it is imperative that you return to your primary care physician (or establish a relationship with a primary care physician if you do not have one) for your aftercare needs so that they can reassess your need for medications and monitor your lab values.   Discharge instructions   Complete by: As directed    Refrain from taking goody powder, ibuprofen, NSAIDs while on Xarelto to prevent gastric ulcers   Increase activity slowly   Complete by: As directed      Allergies as of 11/27/2019   No Known Allergies     Medication List    STOP taking these medications   GOODY HEADACHE PO   meloxicam 15 MG tablet Commonly known as: MOBIC     TAKE these medications   atorvastatin 10 MG tablet Commonly known as: LIPITOR Take 10 mg by mouth daily.   hydroxychloroquine 200 MG tablet Commonly known as: PLAQUENIL Take 400 mg by mouth daily.   omeprazole 20 MG tablet Commonly known as: PRILOSEC OTC Take 20 mg by mouth daily.   Rivaroxaban 15 & 20 MG Tbpk Follow package directions: Take one 15mg  tablet by mouth twice a day. On day 22, switch to one 20mg  tablet once a day. Take with food.      Follow-up Information    Your PCP. Schedule an appointment as soon as possible for a visit in 1 week(s).          No Known Allergies  Consultations:  None    Procedures/Studies: VAS [place] LOWER EXTREMITY VENOUS (DVT)  Result Date: 11/26/2019  Lower Venous DVTStudy Indications: Pulmonary embolism.  Comparison Study: no prior Performing Technologist: Blanch Media RVS  Examination Guidelines: A complete evaluation includes B-mode imaging, spectral Doppler, color Doppler, and power Doppler as needed of all accessible portions of each vessel. Bilateral testing is considered an integral part of a complete examination. Limited examinations for reoccurring indications may be performed as noted. The reflux portion of the exam is performed  with the patient in reverse Trendelenburg.  +---------+---------------+---------+-----------+----------+--------------+ RIGHT    CompressibilityPhasicitySpontaneityPropertiesThrombus Aging +---------+---------------+---------+-----------+----------+--------------+ CFV      Full                                                        +---------+---------------+---------+-----------+----------+--------------+ SFJ      Full                                                        +---------+---------------+---------+-----------+----------+--------------+ FV Prox  Full                                                        +---------+---------------+---------+-----------+----------+--------------+  FV Mid   Full                                                        +---------+---------------+---------+-----------+----------+--------------+ FV DistalFull                                                        +---------+---------------+---------+-----------+----------+--------------+ PFV      Full                                                        +---------+---------------+---------+-----------+----------+--------------+ POP      None           No       No                   Acute          +---------+---------------+---------+-----------+----------+--------------+ PTV      Partial                                      Acute          +---------+---------------+---------+-----------+----------+--------------+ PERO                                                  Not visualized +---------+---------------+---------+-----------+----------+--------------+   +---------+---------------+---------+-----------+----------+--------------+ LEFT     CompressibilityPhasicitySpontaneityPropertiesThrombus Aging +---------+---------------+---------+-----------+----------+--------------+ CFV      Full           Yes      Yes                                  +---------+---------------+---------+-----------+----------+--------------+ SFJ      Full                                                        +---------+---------------+---------+-----------+----------+--------------+ FV Prox  Full                                                        +---------+---------------+---------+-----------+----------+--------------+ FV Mid   Full                                                        +---------+---------------+---------+-----------+----------+--------------+ FV DistalFull                                                        +---------+---------------+---------+-----------+----------+--------------+  PFV      Full                                                        +---------+---------------+---------+-----------+----------+--------------+ POP      Full           Yes      Yes                                 +---------+---------------+---------+-----------+----------+--------------+ PTV      Full                                                        +---------+---------------+---------+-----------+----------+--------------+ PERO                                                  Not visualized +---------+---------------+---------+-----------+----------+--------------+     Summary: RIGHT: - Findings consistent with acute deep vein thrombosis involving the right popliteal vein, and right posterior tibial veins. - No cystic structure found in the popliteal fossa.  LEFT: - There is no evidence of deep vein thrombosis in the lower extremity.  - No cystic structure found in the popliteal fossa.  *See table(s) above for measurements and observations. Electronically signed by Servando Snare MD on 11/26/2019 at 3:58:08 PM.    Final        Discharge Exam: Vitals:   11/27/19 0334 11/27/19 0721  BP: 122/74 119/75  Pulse: 76 71  Resp: 15 18  Temp: 98.1 F (36.7 C) 97.9 F (36.6 C)  SpO2: 92% 95%    General: Pt  is alert, awake, not in acute distress Cardiovascular: RRR, S1/S2 +, no edema Respiratory: CTA bilaterally, no wheezing, no rhonchi, no respiratory distress, no conversational dyspnea, on room air  Abdominal: Soft, NT, ND, bowel sounds + Extremities: no edema, no cyanosis Psych: Normal mood and affect, stable judgement and insight     The results of significant diagnostics from this hospitalization (including imaging, microbiology, ancillary and laboratory) are listed below for reference.     Microbiology: Recent Results (from the past 240 hour(s))  MRSA PCR Screening     Status: None   Collection Time: 11/25/19  7:40 PM   Specimen: Nasal Mucosa; Nasopharyngeal  Result Value Ref Range Status   MRSA by PCR NEGATIVE NEGATIVE Final    Comment:        The GeneXpert MRSA Assay (FDA approved for NASAL specimens only), is one component of a comprehensive MRSA colonization surveillance program. It is not intended to diagnose MRSA infection nor to guide or monitor treatment for MRSA infections. Performed at [facility], [place] [address] [place], [place] [address]      Labs: BNP (last 3 results) No results for input(s): BNP in the last 8760 hours. Basic Metabolic Panel: Recent Labs  Lab 11/26/19 0231 11/27/19 0216  NA 141 140  K 3.0* 3.9  CL 106 110  CO2 26 25  GLUCOSE 95  133*  BUN 7 11  CREATININE 0.64 0.61  CALCIUM 8.5* 8.7*  MG  --  1.9   Liver Function Tests: Recent Labs  Lab 11/26/19 0231  AST 13*  ALT 14  ALKPHOS 91  BILITOT 1.0  PROT 6.0*  ALBUMIN 3.4*   No results for input(s): LIPASE, AMYLASE in the last 168 hours. No results for input(s): AMMONIA in the last 168 hours. CBC: Recent Labs  Lab 11/26/19 0231 11/27/19 0216  WBC 4.4 4.7  HGB 11.8* 11.9*  HCT 36.6 36.5  MCV 88.6 89.5  PLT 206 219   Cardiac Enzymes: No results for input(s): CKTOTAL, CKMB, CKMBINDEX, TROPONINI in the last 168 hours. BNP: Invalid input(s): POCBNP CBG: No  results for input(s): GLUCAP in the last 168 hours. D-Dimer No results for input(s): DDIMER in the last 72 hours. Hgb A1c No results for input(s): HGBA1C in the last 72 hours. Lipid Profile Recent Labs    11/27/19 0216  CHOL 179  HDL 50  LDLCALC 111*  TRIG 88  CHOLHDL 3.6   Thyroid function studies No results for input(s): TSH, T4TOTAL, T3FREE, THYROIDAB in the last 72 hours.  Invalid input(s): FREET3 Anemia work up No results for input(s): VITAMINB12, FOLATE, FERRITIN, TIBC, IRON, RETICCTPCT in the last 72 hours. Urinalysis No results found for: COLORURINE, APPEARANCEUR, LABSPEC, PHURINE, GLUCOSEU, HGBUR, BILIRUBINUR, KETONESUR, PROTEINUR, UROBILINOGEN, NITRITE, LEUKOCYTESUR Sepsis Labs Invalid input(s): PROCALCITONIN,  WBC,  LACTICIDVEN Microbiology Recent Results (from the past 240 hour(s))  MRSA PCR Screening     Status: None   Collection Time: 11/25/19  7:40 PM   Specimen: Nasal Mucosa; Nasopharyngeal  Result Value Ref Range Status   MRSA by PCR NEGATIVE NEGATIVE Final    Comment:        The GeneXpert MRSA Assay (FDA approved for NASAL specimens only), is one component of a comprehensive MRSA colonization surveillance program. It is not intended to diagnose MRSA infection nor to guide or monitor treatment for MRSA infections. Performed at [facility], [address] [place], [place] [address]      Patient was seen and examined on the day of discharge and was found to be in stable condition. Time coordinating discharge: 25 minutes including assessment and coordination of care, as well as examination of the patient.   SIGNED:  Noralee Stain, DO [facility] 11/27/2019, 9:08 AM

## 2019-11-27 NOTE — Progress Notes (Signed)
  SATURATION QUALIFICATIONS: (This note is used to comply with regulatory documentation for home oxygen)  Patient Saturations on Room Air at Rest = 95%  Patient Saturations on Room Air while Ambulating = 87%  Patient Saturations on 2 Liters of oxygen while Ambulating = 93%  Please briefly explain why patient needs home oxygen: Pt demonstrates increased work of breathing, shortness of breath, and reduced activity tolerance when ambulating without the use of supplemental oxygen.

## 2019-11-27 NOTE — Progress Notes (Signed)
Removed PIV access x 2 and received discharge instructions. She understood it well. Pt's pharmacy didn't have Lesia Hausen and she called to another pharmacy for getting her medications. Pt's husband brought the oxygen tank. Pt took all her belongings. HS [employer]

## 2019-11-27 NOTE — Progress Notes (Signed)
Physical Therapy Treatment Patient Details Name: Meagan Doyle MRN: [id number] DOB: October 17, 1965 Today's Date: 11/27/2019    History of Present Illness Pt is a 54 y.o. F with significant PMH of PE and recent right knee arthroscopy 11/09/25 who presents wtih shortness of breath and cough. CTA chest positive for acute bilateral pulmonary emboli, also has RLE DVT.     PT Comments    Pt tolerated treatment well, ambulating for increased distances however continuing to dest on room air and needing 2L [place]. Pt remains with antalgic gait pattern and gait deviations from prior R knee arthroscopy and will benefit from outpatient PT services to mitigate gait deviations. PT recommends use of 2L [place] at home for all OOB activity to improve activity tolerance.   Follow Up Recommendations  Outpatient PT;Supervision for mobility/OOB     Equipment Recommendations  None recommended by PT    Recommendations for Other Services       Precautions / Restrictions Precautions Precautions: Other (comment) Precaution Comments: watch O2 sats Restrictions Weight Bearing Restrictions: No    Mobility  Bed Mobility Overal bed mobility: Modified Independent                Transfers Overall transfer level: Independent   Transfers: Sit to/from Stand Sit to Stand: Independent            Ambulation/Gait Ambulation/Gait assistance: Modified independent (Device/Increase time) Gait Distance (Feet): 400 Feet Assistive device: IV Pole Gait Pattern/deviations: Step-through pattern;Decreased step length - right;Antalgic Gait velocity: reduced Gait velocity interpretation: >2.62 ft/sec, indicative of community ambulatory General Gait Details: antalgic gait on RLE after recent R knee arthroscopy   Stairs             Wheelchair Mobility    Modified Rankin (Stroke Patients Only)       Balance Overall balance assessment: Needs assistance Sitting-balance support: No upper extremity  supported;Feet supported Sitting balance-Leahy Scale: Normal     Standing balance support: Single extremity supported Standing balance-Leahy Scale: Good Standing balance comment: modI with UE support of IV pole                            Cognition Arousal/Alertness: Awake/alert Behavior During Therapy: WFL for tasks assessed/performed Overall Cognitive Status: Within Functional Limits for tasks assessed                                        Exercises      General Comments General comments (skin integrity, edema, etc.): pt on RA resting at 95% SpO2, sats drop to 87% when ambulating on room air, sats at 93% and above on 2L [place]      Pertinent Vitals/Pain Pain Assessment: No/denies pain    Home Living                      Prior Function            PT Goals (current goals can now be found in the care plan section) Acute Rehab PT Goals Patient Stated Goal: get stronger Progress towards PT goals: Progressing toward goals    Frequency    Min 3X/week      PT Plan Current plan remains appropriate    Co-evaluation              AM-PAC PT "6 Clicks" Mobility  Outcome Measure  Help needed turning from your back to your side while in a flat bed without using bedrails?: None Help needed moving from lying on your back to sitting on the side of a flat bed without using bedrails?: None Help needed moving to and from a bed to a chair (including a wheelchair)?: None Help needed standing up from a chair using your arms (e.g., wheelchair or bedside chair)?: None Help needed to walk in hospital room?: None Help needed climbing 3-5 steps with a railing? : A Little 6 Click Score: 23    End of Session Equipment Utilized During Treatment: Oxygen Activity Tolerance: Patient tolerated treatment well Patient left: in bed;with call bell/phone within reach Nurse Communication: Mobility status PT Visit Diagnosis: Difficulty in walking, not  elsewhere classified (R26.2);Other abnormalities of gait and mobility (R26.89)     Time: 7955-8316 PT Time Calculation (min) (ACUTE ONLY): 11 min  Charges:  $Gait Training: 8-22 mins                     Arlyss Gandy, PT, DPT Acute Rehabilitation Pager: [phone]    Arlyss Gandy 11/27/2019, 9:30 AM

## 2019-11-27 NOTE — Discharge Instructions (Signed)
Pulmonary Embolism  A pulmonary embolism (PE) is a sudden blockage or decrease of blood flow in one or both lungs. Most blockages come from a blood clot that forms in the vein of a lower leg, thigh, or arm (deep vein thrombosis, DVT) and travels to the lungs. A clot is blood that has thickened into a gel or solid. PE is a dangerous and life-threatening condition that needs to be treated right away. What are the causes? This condition is usually caused by a blood clot that forms in a vein and moves to the lungs. In rare cases, it may be caused by air, fat, part of a tumor, or other tissue that moves through the veins and into the lungs. What increases the risk? The following factors may make you more likely to develop this condition:  Experiencing a traumatic injury, such as breaking a hip or leg.  Having: ? A spinal cord injury. ? Orthopedic surgery, especially hip or knee replacement. ? Any major surgery. ? A stroke. ? DVT. ? Blood clots or blood clotting disease. ? Long-term (chronic) lung or heart disease. ? Cancer treated with chemotherapy. ? A central venous catheter.  Taking medicines that contain estrogen. These include birth control pills and hormone replacement therapy.  Being: ? Pregnant. ? In the period of time after your baby is delivered (postpartum). ? Older than age 60. ? Overweight. ? A smoker, especially if you have other risks. What are the signs or symptoms? Symptoms of this condition usually start suddenly and include:  Shortness of breath during activity or at rest.  Coughing, coughing up blood, or coughing up blood-tinged mucus.  Chest pain that is often worse with deep breaths.  Rapid or irregular heartbeat.  Feeling light-headed or dizzy.  Fainting.  Feeling anxious.  Fever.  Sweating.  Pain and swelling in a leg. This is a symptom of DVT, which can lead to PE. How is this diagnosed? This condition may be diagnosed based on:  Your medical  history.  A physical exam.  Blood tests.  CT pulmonary angiogram. This test checks blood flow in and around your lungs.  Ventilation-perfusion scan, also called a lung VQ scan. This test measures air flow and blood flow to the lungs.  An ultrasound of the legs. How is this treated? Treatment for this condition depends on many factors, such as the cause of your PE, your risk for bleeding or developing more clots, and other medical conditions you have. Treatment aims to remove, dissolve, or stop blood clots from forming or growing larger. Treatment may include:  Medicines, such as: ? Blood thinning medicines (anticoagulants) to stop clots from forming. ? Medicines that dissolve clots (thrombolytics).  Procedures, such as: ? Using a flexible tube to remove a blood clot (embolectomy) or to deliver medicine to destroy it (catheter-directed thrombolysis). ? Inserting a filter into a large vein that carries blood to the heart (inferior vena cava). This filter (vena cava filter) catches blood clots before they reach the lungs. ? Surgery to remove the clot (surgical embolectomy). This is rare. You may need a combination of immediate, long-term (up to 3 months after diagnosis), and extended (more than 3 months after diagnosis) treatments. Your treatment may continue for several months (maintenance therapy). You and your health care provider will work together to choose the treatment program that is best for you. Follow these instructions at home: Medicines  Take over-the-counter and prescription medicines only as told by your health care provider.  If you   are taking an anticoagulant medicine: ? Take the medicine every day at the same time each day. ? Understand what foods and drugs interact with your medicine. ? Understand the side effects of this medicine, including excessive bruising or bleeding. Ask your health care provider or pharmacist about other side effects. General  instructions  Wear a medical alert bracelet or carry a medical alert card that says you have had a PE and lists what medicines you take.  Ask your health care provider when you may return to your normal activities. Avoid sitting or lying for a long time without moving.  Maintain a healthy weight. Ask your health care provider what weight is healthy for you.  Do not use any products that contain nicotine or tobacco, such as cigarettes, e-cigarettes, and chewing tobacco. If you need help quitting, ask your health care provider.  Talk with your health care provider about any travel plans. It is important to make sure that you are still able to take your medicine while on trips.  Keep all follow-up visits as told by your health care provider. This is important. Contact a health care provider if:  You missed a dose of your blood thinner medicine. Get help right away if:  You have: ? New or increased pain, swelling, warmth, or redness in an arm or leg. ? Numbness or tingling in an arm or leg. ? Shortness of breath during activity or at rest. ? A fever. ? Chest pain. ? A rapid or irregular heartbeat. ? A severe headache. ? Vision changes. ? A serious fall or accident, or you hit your head. ? Stomach (abdominal) pain. ? Blood in your vomit, stool, or urine. ? A cut that will not stop bleeding.  You cough up blood.  You feel light-headed or dizzy.  You cannot move your arms or legs.  You are confused or have memory loss. These symptoms may represent a serious problem that is an emergency. Do not wait to see if the symptoms will go away. Get medical help right away. Call your local emergency services (911 in the [place] Do not drive yourself to the hospital. Summary  A pulmonary embolism (PE) is a sudden blockage or decrease of blood flow in one or both lungs. PE is a dangerous and life-threatening condition that needs to be treated right away.  Treatments for this condition usually  include medicines to thin your blood (anticoagulants) or medicines to break apart blood clots (thrombolytics).  If you are given blood thinners, it is important to take the medicine every day at the same time each day.  Understand what foods and drugs interact with any medicines that you are taking.  If you have signs of PE or DVT, call your local emergency services (911 in the [place] This information is not intended to replace advice given to you by your health care provider. Make sure you discuss any questions you have with your health care provider. Document Revised: 06/23/2018 Document Reviewed: 06/23/2018 Elsevier Patient Education  2020 [place] on my medicine - XARELTO (rivaroxaban)  This medication education was reviewed with me or my healthcare representative as part of my discharge preparation.  The pharmacist that spoke with me during my hospital stay was:  Ronna Polio, [place]? Xarelto was prescribed to treat blood clots that may have been found in the veins of your legs (deep vein thrombosis) or in your lungs (pulmonary embolism) and to reduce  the risk of them occurring again.  What do you need to know about Xarelto? The starting dose is one 15 mg tablet taken TWICE daily with food for the FIRST 21 DAYS then on 12/18/19 the dose is changed to one 20 mg tablet taken ONCE A DAY with your evening meal.  DO NOT stop taking Xarelto without talking to the health care provider who prescribed the medication.  Refill your prescription for 20 mg tablets before you run out.  After discharge, you should have regular check-up appointments with your healthcare provider that is prescribing your Xarelto.  In the future your dose may need to be changed if your kidney function changes by a significant amount.  What do you do if you miss a dose? If you are taking Xarelto TWICE DAILY and you miss a dose, take it as soon as you  remember. You may take two 15 mg tablets (total 30 mg) at the same time then resume your regularly scheduled 15 mg twice daily the next day.  If you are taking Xarelto ONCE DAILY and you miss a dose, take it as soon as you remember on the same day then continue your regularly scheduled once daily regimen the next day. Do not take two doses of Xarelto at the same time.   Important Safety Information Xarelto is a blood thinner medicine that can cause bleeding. You should call your healthcare provider right away if you experience any of the following: ? Bleeding from an injury or your nose that does not stop. ? Unusual colored urine (red or dark brown) or unusual colored stools (red or black). ? Unusual bruising for unknown reasons. ? A serious fall or if you hit your head (even if there is no bleeding).  Some medicines may interact with Xarelto and might increase your risk of bleeding while on Xarelto. To help avoid this, consult your healthcare provider or pharmacist prior to using any new prescription or non-prescription medications, including herbals, vitamins, non-steroidal anti-inflammatory drugs (NSAIDs) and supplements.  This website has more information on Xarelto: VisitDestination.com.br.

## 2019-11-27 NOTE — Progress Notes (Addendum)
ANTICOAGULATION CONSULT NOTE  Pharmacy Consult for heparin >> rivaroxaban Indication: pulmonary embolus  Heparin Dosing Weight: 88.3 kg  Labs: Recent Labs    11/25/19 2149 11/26/19 0231 11/27/19 0216  HGB  --  11.8* 11.9*  HCT  --  36.6 36.5  PLT  --  206 219  HEPARINUNFRC 0.60 0.45 0.45  CREATININE  --  0.64 0.61    Assessment: 54 yof presenting to [facility] with SOB since ~4 days after her R knee arthroscopic surgery on 2/11, found to have acute PE, now transferred to [facility]. Patient does have a history of PEs and was on anticoagulation; however, she stated at her office visit today that her doctor took her off years ago. Patient is not currently on anticoagulation PTA. Pharmacy consulted to dose heparin.  Heparin level this AM therapeutic at 0.45. Hgb 11.9, pltc 219 - stable. No bleeding reported  Pharmacy consulted to transition patient from heparin to rivaroxaban for PE.   Goal of Therapy:  Heparin level 0.3-0.7 units/ml Monitor platelets by anticoagulation protocol: Yes   Plan:  Stop IV heparin  Start rivaroxaban 15 mg BID x 21 days, then rivaroxaban 20 mg daily Pharmacy will educate prior to discharge   Danae Orleans, PharmD, [facility] PGY2 Cardiology Pharmacy Resident Phone [phone] 11/27/2019       7:41 AM  Please check [email or web address] for unit-specific pharmacist phone numbers

## 2019-12-28 NOTE — Progress Notes (Deleted)
Cardiology Office Note:    Date:  12/29/2019   ID:  Meagan Doyle, DOB 1966-06-23, MRN [id number]  PCP:  Patient, No Pcp Per  Cardiologist:  Shirlee More, MD   Referring MD: Gweneth Fritter, FNP  ASSESSMENT:    1. Right ventricular enlargement   2. Other acute pulmonary embolism with acute cor pulmonale ([place])   3. Chronic anticoagulation   4. Hyperlipidemia, unspecified hyperlipidemia type   5. Undifferentiated connective tissue disease ([place])    PLAN:    In order of problems listed above:  1. ***  Next appointment   Medication Adjustments/Labs and Tests Ordered: Current medicines are reviewed at length with the patient today.  Concerns regarding medicines are outlined above.  No orders of the defined types were placed in this encounter.  No orders of the defined types were placed in this encounter.    No chief complaint on file. ***  History of Present Illness:    Meagan Doyle is a 54 y.o. female who is being seen today for the evaluation of venous thromboembolism at the request of Goins, Gillis Santa, FNP.  In particular her initial echocardiogram showed right heart strain and this is the reason for referral  to cardiology.  She had previous pulmonary embolism in 2017.  At that time lupus anticoagulant was not detected protein C Antithrombin III and factor V lumen test were normal. .  Chart review shows a history of hiatal hernia and peptic ulcer and anemia.  She sustained pulmonary embolism approximately 11/25/2019 2 weeks after arthroscopic knee surgery.  Chest CTA showed acute bilateral pulmonary emboli with right heart strain defined as submassive.  The echocardiogram showed normal ventricular size and function with mild elevation of pulmonary artery pressure.  She required oxygen for hypoxemia and was initially treated with heparin and transitioned to oral anticoagulant.  Lower extremity duplex showed clot right lower extremity at the popliteal vein.  Summary:    RIGHT:  - Findings consistent with acute deep vein thrombosis involving the right  popliteal vein, and right posterior tibial veins.  - No cystic structure found in the popliteal fossa.  LEFT:  - There is no evidence of deep vein thrombosis in the lower extremity.  - No cystic structure found in the popliteal fossa.    Past Medical History:  Diagnosis Date  . GERD (gastroesophageal reflux disease)   . High blood cholesterol   . Pulmonary embolism [facility])     Past Surgical History:  Procedure Laterality Date  . ABDOMINAL HYSTERECTOMY    . CHOLECYSTECTOMY    . KNEE ARTHROPLASTY      Current Medications: No outpatient medications have been marked as taking for the 12/29/19 encounter (Appointment) with Richardo Priest, MD.     Allergies:   Patient has no known allergies.   Social History   Socioeconomic History  . Marital status: Married    Spouse name: Not on file  . Number of children: Not on file  . Years of education: Not on file  . Highest education level: Not on file  Occupational History  . Not on file  Tobacco Use  . Smoking status: Former Smoker    Years: 15.00    Types: Cigarettes  . Smokeless tobacco: Never Used  Substance and Sexual Activity  . Alcohol use: Not Currently  . Drug use: Never  . Sexual activity: Not on file  Other Topics Concern  . Not on file  Social History Narrative  . Not on  file   Social Determinants of Health   Financial Resource Strain:   . Difficulty of Paying Living Expenses:   Food Insecurity:   . Worried About Programme researcher, broadcasting/film/video in the Last Year:   . Barista in the Last Year:   Transportation Needs:   . Freight forwarder (Medical):   Marland Kitchen Lack of Transportation (Non-Medical):   Physical Activity:   . Days of Exercise per Week:   . Minutes of Exercise per Session:   Stress:   . Feeling of Stress :   Social Connections:   . Frequency of Communication with Friends and Family:   . Frequency of Social  Gatherings with Friends and Family:   . Attends Religious Services:   . Active Member of Clubs or Organizations:   . Attends Banker Meetings:   Marland Kitchen Marital Status:      Family History: The patient's ***family history is not on file.  ROS:   ROS Please see the history of present illness.    *** All other systems reviewed and are negative.  EKGs/Labs/Other Studies Reviewed:    The following studies were reviewed today: ***  EKG:  EKG is *** ordered today.  The ekg ordered today is personally reviewed and demonstrates ***  Recent Labs: 11/26/2019: ALT 14 11/27/2019: BUN 11; Creatinine, Ser 0.61; Hemoglobin 11.9; Magnesium 1.9; Platelets 219; Potassium 3.9; Sodium 140  Recent Lipid Panel    Component Value Date/Time   CHOL 179 11/27/2019 0216   TRIG 88 11/27/2019 0216   HDL 50 11/27/2019 0216   CHOLHDL 3.6 11/27/2019 0216   VLDL 18 11/27/2019 0216   LDLCALC 111 (H) 11/27/2019 0216    Physical Exam:    VS:  There were no vitals taken for this visit.    Wt Readings from Last 3 Encounters:  11/25/19 238 lb 1.6 oz (108 kg)     GEN: *** Well nourished, well developed in no acute distress HEENT: Normal NECK: No JVD; No carotid bruits LYMPHATICS: No lymphadenopathy CARDIAC: ***RRR, no murmurs, rubs, gallops RESPIRATORY:  Clear to auscultation without rales, wheezing or rhonchi  ABDOMEN: Soft, non-tender, non-distended MUSCULOSKELETAL:  No edema; No deformity  SKIN: Warm and dry NEUROLOGIC:  Alert and oriented x 3 PSYCHIATRIC:  Normal affect     Signed, Norman Herrlich, MD  12/29/2019 2:07 PM    [facility]

## 2019-12-29 ENCOUNTER — Ambulatory Visit: Payer: Commercial Managed Care - PPO | Admitting: Cardiology

## 2019-12-29 ENCOUNTER — Encounter: Payer: Self-pay | Admitting: Cardiology

## 2019-12-29 ENCOUNTER — Other Ambulatory Visit: Payer: Self-pay

## 2019-12-29 ENCOUNTER — Ambulatory Visit (INDEPENDENT_AMBULATORY_CARE_PROVIDER_SITE_OTHER): Payer: Commercial Managed Care - PPO | Admitting: Cardiology

## 2019-12-29 VITALS — BP 148/84 | HR 73 | Temp 97.7°F | Ht 68.0 in | Wt 240.8 lb

## 2019-12-29 DIAGNOSIS — Z86711 Personal history of pulmonary embolism: Secondary | ICD-10-CM | POA: Insufficient documentation

## 2019-12-29 DIAGNOSIS — R0600 Dyspnea, unspecified: Secondary | ICD-10-CM

## 2019-12-29 DIAGNOSIS — K219 Gastro-esophageal reflux disease without esophagitis: Secondary | ICD-10-CM

## 2019-12-29 DIAGNOSIS — R0609 Other forms of dyspnea: Secondary | ICD-10-CM | POA: Insufficient documentation

## 2019-12-29 DIAGNOSIS — E785 Hyperlipidemia, unspecified: Secondary | ICD-10-CM | POA: Diagnosis not present

## 2019-12-29 DIAGNOSIS — I2782 Chronic pulmonary embolism: Secondary | ICD-10-CM

## 2019-12-29 HISTORY — DX: Other forms of dyspnea: R06.09

## 2019-12-29 HISTORY — DX: Dyspnea, unspecified: R06.00

## 2019-12-29 HISTORY — DX: Personal history of pulmonary embolism: Z86.711

## 2019-12-29 NOTE — Patient Instructions (Signed)
Medication Instructions:  Your physician recommends that you continue on your current medications as directed. Please refer to the Current Medication list given to you today.  *If you need a refill on your cardiac medications before your next appointment, please call your pharmacy*   Lab Work: None ordered   If you have labs (blood work) drawn today and your tests are completely normal, you will receive your results only by: . MyChart Message (if you have MyChart) OR . A paper copy in the mail If you have any lab test that is abnormal or we need to change your treatment, we will call you to review the results.   Testing/Procedures: Your physician has requested that you have an echocardiogram. Echocardiography is a painless test that uses sound waves to create images of your heart. It provides your doctor with information about the size and shape of your heart and how well your heart's chambers and valves are working. This procedure takes approximately one hour. There are no restrictions for this procedure.     Follow-Up: At [facility], you and your health needs are our priority.  As part of our continuing mission to provide you with exceptional heart care, we have created designated Provider Care Teams.  These Care Teams include your primary Cardiologist (physician) and Advanced Practice Providers (APPs -  Physician Assistants and Nurse Practitioners) who all work together to provide you with the care you need, when you need it.  We recommend signing up for the patient portal called "MyChart".  Sign up information is provided on this After Visit Summary.  MyChart is used to connect with patients for Virtual Visits (Telemedicine).  Patients are able to view lab/test results, encounter notes, upcoming appointments, etc.  Non-urgent messages can be sent to your provider as well.   To learn more about what you can do with MyChart, go to [email or web address].    Your next appointment:    6 month(s)  The format for your next appointment:   In Person  Provider:   Robert Krasowski, MD   Other Instructions None   

## 2019-12-29 NOTE — Progress Notes (Signed)
Cardiology Consultation:    Date:  12/29/2019   ID:  Meagan Doyle, DOB 1966-08-13, MRN [id number]  PCP:  Patient, No Pcp Per  Cardiologist:  Gypsy Balsam, MD   Referring MD: Audie Pinto, FNP   No chief complaint on file. Pulmonary emboli  History of Present Illness:    Meagan Doyle is a 54 y.o. female who is being seen today for the evaluation of pulmonary emboli at the request of Darcus Austin, Gwenith Spitz, FNP.*Is very interesting she is a young 36 is a woman.  Few years ago she ended up having gallbladder surgery that was complicated with DVT and pulmonary emboli.  Few weeks ago she ended up having right laparoscopic knee surgery done she was discharged home 2 days later started having shortness of breath went to her primary care physician, she was find to have low saturation 83 she was sent to [facility] where she was find to have massive pulmonary emboli with right ventricular strain.  On CT the right ventricle to left ventricle ratio was 1.04.  Echocardiogram done at that time showed right ventricle poorly visualized, pulmonary artery pressure was assessed as 44 mmHg, there is mild to moderate intensity vegetation.  That was considered for at least submassive pulmonary emboli after that she was transferred to [facility] where she was managed conservatively with anticoagulation. She is coming today to our office to be established patient.  Overall she says she feels better. She still complain of having exertional shortness of breath.  Denies having any chest pain and she said 5 years ago when I had my first pulmonary emboli had some chest pain this time I do not have any.  Still does have some swelling of right lower extremity.  She use oxygen initially all the time now on as-needed basis.  She is able to walk to the store but she said she had difficulty doing it because of fatigue. Denies having any syncope, denies having any palpitations.  Past Medical History:   Diagnosis Date  . GERD (gastroesophageal reflux disease)   . High blood cholesterol   . Pulmonary embolism [facility])     Past Surgical History:  Procedure Laterality Date  . ABDOMINAL HYSTERECTOMY    . CHOLECYSTECTOMY    . KNEE ARTHROPLASTY      Current Medications: Current Meds  Medication Sig  . atorvastatin (LIPITOR) 10 MG tablet Take 10 mg by mouth daily.  . hydroxychloroquine (PLAQUENIL) 200 MG tablet Take 400 mg by mouth daily.  Marland Kitchen omeprazole (PRILOSEC OTC) 20 MG tablet Take 20 mg by mouth daily.  . OXYGEN Inhale into the lungs as needed (2L).  . Rivaroxaban 15 & 20 MG TBPK Follow package directions: Take one 15mg  tablet by mouth twice a day. On day 22, switch to one 20mg  tablet once a day. Take with food.     Allergies:   Patient has no known allergies.   Social History   Socioeconomic History  . Marital status: Married    Spouse name: Not on file  . Number of children: Not on file  . Years of education: Not on file  . Highest education level: Not on file  Occupational History  . Not on file  Tobacco Use  . Smoking status: Former Smoker    Years: 15.00    Types: Cigarettes  . Smokeless tobacco: Never Used  Substance and Sexual Activity  . Alcohol use: Not Currently  . Drug use: Never  . Sexual activity:  Not on file  Other Topics Concern  . Not on file  Social History Narrative  . Not on file   Social Determinants of Health   Financial Resource Strain:   . Difficulty of Paying Living Expenses:   Food Insecurity:   . Worried About Programme researcher, broadcasting/film/video in the Last Year:   . Barista in the Last Year:   Transportation Needs:   . Freight forwarder (Medical):   Marland Kitchen Lack of Transportation (Non-Medical):   Physical Activity:   . Days of Exercise per Week:   . Minutes of Exercise per Session:   Stress:   . Feeling of Stress :   Social Connections:   . Frequency of Communication with Friends and Family:   . Frequency of Social Gatherings with  Friends and Family:   . Attends Religious Services:   . Active Member of Clubs or Organizations:   . Attends Banker Meetings:   Marland Kitchen Marital Status:      Family History: The patient's family history is not on file. ROS:   Please see the history of present illness.    All 14 point review of systems negative except as described per history of present illness.  EKGs/Labs/Other Studies Reviewed:    The following studies were reviewed today: I did review echocardiogram as well as CT and discharge from the hospitalization    Recent Labs: 11/26/2019: ALT 14 11/27/2019: BUN 11; Creatinine, Ser 0.61; Hemoglobin 11.9; Magnesium 1.9; Platelets 219; Potassium 3.9; Sodium 140  Recent Lipid Panel    Component Value Date/Time   CHOL 179 11/27/2019 0216   TRIG 88 11/27/2019 0216   HDL 50 11/27/2019 0216   CHOLHDL 3.6 11/27/2019 0216   VLDL 18 11/27/2019 0216   LDLCALC 111 (H) 11/27/2019 0216    Physical Exam:    VS:  BP (!) 148/84   Pulse 73   Temp 97.7 F (36.5 C)   Ht 5\' 8"  (1.727 m)   Wt 240 lb 12.8 oz (109.2 kg)   SpO2 98%   BMI 36.61 kg/m     Wt Readings from Last 3 Encounters:  12/29/19 240 lb 12.8 oz (109.2 kg)  11/25/19 238 lb 1.6 oz (108 kg)     GEN:  Well nourished, well developed in no acute distress HEENT: Normal NECK: No JVD; No carotid bruits LYMPHATICS: No lymphadenopathy CARDIAC: RRR, no murmurs, no rubs, no gallops RESPIRATORY:  Clear to auscultation without rales, wheezing or rhonchi  ABDOMEN: Soft, non-tender, non-distended MUSCULOSKELETAL:  No edema; No deformity  SKIN: Warm and dry NEUROLOGIC:  Alert and oriented x 3 PSYCHIATRIC:  Normal affect   ASSESSMENT:    1. 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 History of pulmonary embolism   3. Hyperlipidemia, unspecified hyperlipidemia type   4. Gastroesophageal reflux disease without esophagitis   5. Dyspnea on exertion    PLAN:    In order of problems  listed above:  1. History of pulmonary emboli.  11/27/19 is very interesting, 5 years ago she did have gallbladder surgery complicated with pulmonary emboli.  This is secondary to that also after surgery.  So realistically probably need to be treated as provoked pulmonary emboli.  At the same time this is the second episode of pulmonary emboli and she does have already some evidence of pulmonary hypertension, therefore we may be forced to continue anticoagulation indefinitely.  I will schedule him to have another echocardiogram to reassess  right ventricle size and function.  We will continue anticoagulation at present dose. 2. Hyperlipidemia.  She is on statin which I will continue. 3. Gastroesophageal reflux disease.  That seems to be controlled. 4. Dyspnea on exertion related to pulmonary emboli and pulmonary hypertension.  Continue anticoagulation.  Echocardiogram will be done to recheck pulmonary to pressure ventricle size and function.  If we will identified significant pulmonary hypertension she may be referred to pulmonary hypertension clinic   Medication Adjustments/Labs and Tests Ordered: Current medicines are reviewed at length with the patient today.  Concerns regarding medicines are outlined above.  Orders Placed This Encounter  Procedures  . ECHOCARDIOGRAM COMPLETE   No orders of the defined types were placed in this encounter.   Signed, Park Liter, MD, [facility]. 12/29/2019 [place]

## 2020-01-05 ENCOUNTER — Ambulatory Visit (INDEPENDENT_AMBULATORY_CARE_PROVIDER_SITE_OTHER): Payer: Commercial Managed Care - PPO

## 2020-01-05 ENCOUNTER — Other Ambulatory Visit: Payer: Self-pay

## 2020-01-05 DIAGNOSIS — I2782 Chronic pulmonary embolism: Secondary | ICD-10-CM

## 2020-01-06 ENCOUNTER — Telehealth: Payer: Self-pay

## 2020-01-06 NOTE — Telephone Encounter (Signed)
Follow up  ° ° °Pt is returning call  ° ° °Please call back  °

## 2020-01-06 NOTE — Telephone Encounter (Signed)
-----   Message from Georgeanna Lea, MD sent at 01/05/2020  9:48 PM EDT ----- Echocardiogram showed normal left ventricular ejection fraction, trivial mitral valve regurgitation, overall looks good

## 2020-01-06 NOTE — Telephone Encounter (Signed)
Patient called back asking to speak with me in regards to her results that I had called her with earlier this morning. I went over the results with her again and confirmed that we would call her when it got closer to time to schedule her next office visit.  No other questions or concerns noted at this time.  Encouraged patient to call back with any questions or concerns.

## 2020-01-06 NOTE — Telephone Encounter (Signed)
Spoke with patient regarding results.  Patient verbalizes understanding and is agreeable to plan of care. Advised patient to call back with any issues or concerns.  

## 2020-01-06 NOTE — Telephone Encounter (Signed)
Left message on patients voicemail to please return our call.   

## 2020-03-13 DIAGNOSIS — Z9889 Other specified postprocedural states: Secondary | ICD-10-CM

## 2020-03-13 HISTORY — DX: Other specified postprocedural states: Z98.890

## 2020-05-01 DIAGNOSIS — I169 Hypertensive crisis, unspecified: Secondary | ICD-10-CM | POA: Insufficient documentation

## 2020-05-01 DIAGNOSIS — I10 Essential (primary) hypertension: Secondary | ICD-10-CM | POA: Insufficient documentation

## 2020-05-01 HISTORY — DX: Essential (primary) hypertension: I10

## 2020-05-01 HISTORY — DX: Hypertensive crisis, unspecified: I16.9

## 2020-06-14 DIAGNOSIS — R600 Localized edema: Secondary | ICD-10-CM | POA: Insufficient documentation

## 2020-06-14 HISTORY — DX: Localized edema: R60.0

## 2020-07-06 ENCOUNTER — Ambulatory Visit: Payer: Commercial Managed Care - PPO | Admitting: Cardiology

## 2020-07-31 ENCOUNTER — Encounter: Payer: Self-pay | Admitting: Cardiology

## 2020-07-31 ENCOUNTER — Other Ambulatory Visit: Payer: Self-pay

## 2020-07-31 ENCOUNTER — Ambulatory Visit (INDEPENDENT_AMBULATORY_CARE_PROVIDER_SITE_OTHER): Payer: Commercial Managed Care - PPO | Admitting: Cardiology

## 2020-07-31 VITALS — BP 120/90 | HR 86 | Ht 68.0 in | Wt 247.0 lb

## 2020-07-31 DIAGNOSIS — Z86711 Personal history of pulmonary embolism: Secondary | ICD-10-CM | POA: Diagnosis not present

## 2020-07-31 DIAGNOSIS — R0600 Dyspnea, unspecified: Secondary | ICD-10-CM

## 2020-07-31 DIAGNOSIS — I2699 Other pulmonary embolism without acute cor pulmonale: Secondary | ICD-10-CM

## 2020-07-31 DIAGNOSIS — E785 Hyperlipidemia, unspecified: Secondary | ICD-10-CM

## 2020-07-31 DIAGNOSIS — R0609 Other forms of dyspnea: Secondary | ICD-10-CM

## 2020-07-31 NOTE — Patient Instructions (Signed)
Medication Instructions:  Your physician recommends that you continue on your current medications as directed. Please refer to the Current Medication list given to you today.  *If you need a refill on your cardiac medications before your next appointment, please call your pharmacy*   Lab Work: Your physician recommends that you return for lab work today: bmp   If you have labs (blood work) drawn today and your tests are completely normal, you will receive your results only by: . MyChart Message (if you have MyChart) OR . A paper copy in the mail If you have any lab test that is abnormal or we need to change your treatment, we will call you to review the results.   Testing/Procedures: Your physician has requested that you have an echocardiogram. Echocardiography is a painless test that uses sound waves to create images of your heart. It provides your doctor with information about the size and shape of your heart and how well your heart's chambers and valves are working. This procedure takes approximately one hour. There are no restrictions for this procedure.     Follow-Up: At [facility], you and your health needs are our priority.  As part of our continuing mission to provide you with exceptional heart care, we have created designated Provider Care Teams.  These Care Teams include your primary Cardiologist (physician) and Advanced Practice Providers (APPs -  Physician Assistants and Nurse Practitioners) who all work together to provide you with the care you need, when you need it.  We recommend signing up for the patient portal called "MyChart".  Sign up information is provided on this After Visit Summary.  MyChart is used to connect with patients for Virtual Visits (Telemedicine).  Patients are able to view lab/test results, encounter notes, upcoming appointments, etc.  Non-urgent messages can be sent to your provider as well.   To learn more about what you can do with MyChart, go to  [email or web address].    Your next appointment:   3 month(s)  The format for your next appointment:   In Person  Provider:   Robert Krasowski, MD   Other Instructions   Echocardiogram An echocardiogram is a procedure that uses painless sound waves (ultrasound) to produce an image of the heart. Images from an echocardiogram can provide important information about:  Signs of coronary artery disease (CAD).  Aneurysm detection. An aneurysm is a weak or damaged part of an artery wall that bulges out from the normal force of blood pumping through the body.  Heart size and shape. Changes in the size or shape of the heart can be associated with certain conditions, including heart failure, aneurysm, and CAD.  Heart muscle function.  Heart valve function.  Signs of a past heart attack.  Fluid buildup around the heart.  Thickening of the heart muscle.  A tumor or infectious growth around the heart valves. Tell a health care provider about:  Any allergies you have.  All medicines you are taking, including vitamins, herbs, eye drops, creams, and over-the-counter medicines.  Any blood disorders you have.  Any surgeries you have had.  Any medical conditions you have.  Whether you are pregnant or may be pregnant. What are the risks? Generally, this is a safe procedure. However, problems may occur, including:  Allergic reaction to dye (contrast) that may be used during the procedure. What happens before the procedure? No specific preparation is needed. You may eat and drink normally. What happens during the procedure?   An IV tube   may be inserted into one of your veins.  You may receive contrast through this tube. A contrast is an injection that improves the quality of the pictures from your heart.  A gel will be applied to your chest.  A wand-like tool (transducer) will be moved over your chest. The gel will help to transmit the sound waves from the  transducer.  The sound waves will harmlessly bounce off of your heart to allow the heart images to be captured in real-time motion. The images will be recorded on a computer. The procedure may vary among health care providers and hospitals. What happens after the procedure?  You may return to your normal, everyday life, including diet, activities, and medicines, unless your health care provider tells you not to do that. Summary  An echocardiogram is a procedure that uses painless sound waves (ultrasound) to produce an image of the heart.  Images from an echocardiogram can provide important information about the size and shape of your heart, heart muscle function, heart valve function, and fluid buildup around your heart.  You do not need to do anything to prepare before this procedure. You may eat and drink normally.  After the echocardiogram is completed, you may return to your normal, everyday life, unless your health care provider tells you not to do that. This information is not intended to replace advice given to you by your health care provider. Make sure you discuss any questions you have with your health care provider. Document Revised: 01/06/2019 Document Reviewed: 10/18/2016 Elsevier Patient Education  2020 Elsevier Inc.   

## 2020-07-31 NOTE — Progress Notes (Signed)
Cardiology Office Note:    Date:  07/31/2020   ID:  Meagan Doyle, DOB [birth year], MRN [id number]  PCP:  Audie Pinto, FNP  Cardiologist:  Gypsy Balsam, MD    Referring MD: No ref. provider found   Chief Complaint  Patient presents with  . Follow-up  I am still somewhat short of breath  History of Present Illness:    Meagan Doyle is a 54 y.o. female   who is being seen today for the evaluation of pulmonary emboli at the request of Meagan Doyle, Meagan Spitz, FNP.*Is very interesting she is a young 69 is a woman.  Few years ago she ended up having gallbladder surgery that was complicated with DVT and pulmonary emboli.  Few weeks ago she ended up having right laparoscopic knee surgery done she was discharged home 2 days later started having shortness of breath went to her primary care physician, she was find to have low saturation 83 she was sent to [facility] where she was find to have massive pulmonary emboli with right ventricular strain.  On CT the right ventricle to left ventricle ratio was 1.04.  Echocardiogram done at that time showed right ventricle poorly visualized, pulmonary artery pressure was assessed as 44 mmHg, there is mild to moderate intensity vegetation.  That was considered for at least submassive pulmonary emboli after that she was transferred to [facility] where she was managed conservatively with anticoagulation. Comes today 2 months for follow-up still having some shortness of breath may be a little bit better but still some still complain of having some swelling of lower extremity especially right lower extremities.  Continue anticoagulation.  Denies have any chest pain tightness squeezing pressure or palpitations  Past Medical History:  Diagnosis Date  . GERD (gastroesophageal reflux disease)   . High blood cholesterol   . Pulmonary embolism [facility])     Past Surgical History:  Procedure Laterality Date  . ABDOMINAL HYSTERECTOMY    .  CHOLECYSTECTOMY    . KNEE ARTHROPLASTY      Current Medications: Current Meds  Medication Sig  . atorvastatin (LIPITOR) 10 MG tablet Take 10 mg by mouth daily.  . furosemide (LASIX) 20 MG tablet Take 20 mg by mouth as needed for edema.  . hydroxychloroquine (PLAQUENIL) 200 MG tablet Take 400 mg by mouth daily.  Marland Kitchen omeprazole (PRILOSEC OTC) 20 MG tablet Take 20 mg by mouth daily.  . OXYGEN Inhale into the lungs as needed (2L).  . Rivaroxaban 15 & 20 MG TBPK Follow package directions: Take one 15mg  tablet by mouth twice a day. On day 22, switch to one 20mg  tablet once a day. Take with food.     Allergies:   Patient has no known allergies.   Social History   Socioeconomic History  . Marital status: Married    Spouse name: Not on file  . Number of children: Not on file  . Years of education: Not on file  . Highest education level: Not on file  Occupational History  . Not on file  Tobacco Use  . Smoking status: Former Smoker    Years: 15.00    Types: Cigarettes  . Smokeless tobacco: Never Used  Vaping Use  . Vaping Use: Never used  Substance and Sexual Activity  . Alcohol use: Not Currently  . Drug use: Never  . Sexual activity: Not on file  Other Topics Concern  . Not on file  Social History Narrative  . Not on file  Social Determinants of Health   Financial Resource Strain:   . Difficulty of Paying Living Expenses: Not on file  Food Insecurity:   . Worried About Programme researcher, broadcasting/film/video in the Last Year: Not on file  . Ran Out of Food in the Last Year: Not on file  Transportation Needs:   . Lack of Transportation (Medical): Not on file  . Lack of Transportation (Non-Medical): Not on file  Physical Activity:   . Days of Exercise per Week: Not on file  . Minutes of Exercise per Session: Not on file  Stress:   . Feeling of Stress : Not on file  Social Connections:   . Frequency of Communication with Friends and Family: Not on file  . Frequency of Social Gatherings  with Friends and Family: Not on file  . Attends Religious Services: Not on file  . Active Member of Clubs or Organizations: Not on file  . Attends Banker Meetings: Not on file  . Marital Status: Not on file     Family History: The patient's family history is not on file. ROS:   Please see the history of present illness.    All 14 point review of systems negative except as described per history of present illness  EKGs/Labs/Other Studies Reviewed:      Recent Labs: 11/26/2019: ALT 14 11/27/2019: BUN 11; Creatinine, Ser 0.61; Hemoglobin 11.9; Magnesium 1.9; Platelets 219; Potassium 3.9; Sodium 140  Recent Lipid Panel    Component Value Date/Time   CHOL 179 11/27/2019 0216   TRIG 88 11/27/2019 0216   HDL 50 11/27/2019 0216   CHOLHDL 3.6 11/27/2019 0216   VLDL 18 11/27/2019 0216   LDLCALC 111 (H) 11/27/2019 0216    Physical Exam:    VS:  BP 120/90 (BP Location: Left Arm, Patient Position: Sitting, Cuff Size: Large)   Pulse 86   Ht 5\' 8"  (1.727 m)   Wt 247 lb (112 kg)   SpO2 97%   BMI 37.56 kg/m     Wt Readings from Last 3 Encounters:  07/31/20 247 lb (112 kg)  12/29/19 240 lb 12.8 oz (109.2 kg)  11/25/19 238 lb 1.6 oz (108 kg)     GEN:  Well nourished, well developed in no acute distress HEENT: Normal NECK: No JVD; No carotid bruits LYMPHATICS: No lymphadenopathy CARDIAC: RRR, no murmurs, no rubs, no gallops RESPIRATORY:  Clear to auscultation without rales, wheezing or rhonchi  ABDOMEN: Soft, non-tender, non-distended MUSCULOSKELETAL:  No edema; No deformity  SKIN: Warm and dry LOWER EXTREMITIES: no swelling NEUROLOGIC:  Alert and oriented x 3 PSYCHIATRIC:  Normal affect   ASSESSMENT:    1. Pulmonary embolism and infarction (HCC)   2. Hyperlipidemia, unspecified hyperlipidemia type   3. Dyspnea on exertion   4. History of pulmonary embolism    PLAN:    In order of problems listed above:  1. History of pulmonary emboli she is  anticoagulated which I will continue.  Last echocardiogram showed no evidence of pulmonary hypertension but the more about her shortness of breath it could be related to pulmonary infarction that she did suffer from but I will schedule her to have limited echocardiogram 3 months make sure we do not have any evidence of right ventricle enlargement or pulmonary hypertension. 2. Hyperlipidemia ask you to have fasting lipid profile done. 3. Dyspnea on exertion multifactorial. 4. History of pulmonary emboli anticoagulated   Medication Adjustments/Labs and Tests Ordered: Current medicines are reviewed at length with the  patient today.  Concerns regarding medicines are outlined above.  Orders Placed This Encounter  Procedures  . Basic metabolic panel  . EKG 12-Lead  . ECHOCARDIOGRAM LIMITED   Medication changes: No orders of the defined types were placed in this encounter.   Signed, Georgeanna Lea, MD, [facility] 07/31/2020 5:03 PM    [facility]

## 2020-08-01 LAB — BASIC METABOLIC PANEL
BUN/Creatinine Ratio: 18 (ref 9–23)
BUN: 11 mg/dL (ref 6–24)
CO2: 23 mmol/L (ref 20–29)
Calcium: 9.3 mg/dL (ref 8.7–10.2)
Chloride: 104 mmol/L (ref 96–106)
Creatinine, Ser: 0.6 mg/dL (ref 0.57–1.00)
GFR calc Af Amer: 120 mL/min/{1.73_m2} (ref >59)
GFR calc non Af Amer: 104 mL/min/{1.73_m2} (ref >59)
Glucose: 75 mg/dL (ref 65–99)
Potassium: 3.7 mmol/L (ref 3.5–5.2)
Sodium: 144 mmol/L (ref 134–144)

## 2020-08-02 ENCOUNTER — Telehealth: Payer: Self-pay | Admitting: Cardiology

## 2020-08-02 NOTE — Telephone Encounter (Signed)
Reviewed BMP results. All questions answered. She also had question regarding her EKG done in clinic, reassurance provided.   Alver Sorrow, NP

## 2020-08-02 NOTE — Telephone Encounter (Signed)
Follow Up:     Pt is returning Hayley's call. 

## 2020-08-20 ENCOUNTER — Other Ambulatory Visit: Payer: Commercial Managed Care - PPO

## 2020-10-31 ENCOUNTER — Other Ambulatory Visit: Payer: Self-pay

## 2020-10-31 DIAGNOSIS — E7800 Pure hypercholesterolemia, unspecified: Secondary | ICD-10-CM | POA: Insufficient documentation

## 2020-10-31 DIAGNOSIS — I2699 Other pulmonary embolism without acute cor pulmonale: Secondary | ICD-10-CM | POA: Insufficient documentation

## 2020-11-02 ENCOUNTER — Ambulatory Visit (INDEPENDENT_AMBULATORY_CARE_PROVIDER_SITE_OTHER): Payer: Commercial Managed Care - PPO | Admitting: Cardiology

## 2020-11-02 ENCOUNTER — Encounter: Payer: Self-pay | Admitting: Cardiology

## 2020-11-02 ENCOUNTER — Other Ambulatory Visit: Payer: Self-pay

## 2020-11-02 VITALS — BP 116/82 | HR 90 | Ht 69.0 in | Wt 232.0 lb

## 2020-11-02 DIAGNOSIS — R0609 Other forms of dyspnea: Secondary | ICD-10-CM

## 2020-11-02 DIAGNOSIS — Z86711 Personal history of pulmonary embolism: Secondary | ICD-10-CM | POA: Diagnosis not present

## 2020-11-02 DIAGNOSIS — R7303 Prediabetes: Secondary | ICD-10-CM | POA: Diagnosis not present

## 2020-11-02 DIAGNOSIS — E782 Mixed hyperlipidemia: Secondary | ICD-10-CM

## 2020-11-02 DIAGNOSIS — R0600 Dyspnea, unspecified: Secondary | ICD-10-CM | POA: Diagnosis not present

## 2020-11-02 DIAGNOSIS — I739 Peripheral vascular disease, unspecified: Secondary | ICD-10-CM

## 2020-11-02 NOTE — Progress Notes (Signed)
Cardiology Office Note:    Date:  11/02/2020   ID:  Walker Shadow, DOB 1966/02/23, MRN [id number]  PCP:  Audie Pinto, FNP  Cardiologist:  Gypsy Balsam, MD    Referring MD: Audie Pinto, FNP   Chief Complaint  Patient presents with  . Follow-up  Still have fatigue and shortness of breath  History of Present Illness:    Meagan Doyle is a 55 y.o. female with past medical history significant for pulmonary emboli as well as DVT.  Initially she had it after cholecystectomy however after that she had knee surgery and ended up having pulmonary emboli with some pulmonary infarct with evidence of right ventricle strain.  At that time pulmonary to pressure by echocardiogram was 44 mmHg.  She was transferred eventually to [facility] of potentially for more aggressive treatment, however she was eventually managed conservatively. She comes today 2 months for follow-up still complain of fatigue tiredness and shortness of breath.  She said she cannot do much without getting shortness of breath on top of that she brought new complaint today she started complaining of having leg pain when she walks.  Suspicious for claudications.  Past Medical History:  Diagnosis Date  . Abdominal pain 10/11/2015  . Aortic atherosclerosis (HCC) 09/27/2016   Formatting of this note might be different from the original. Seen on chest x-ray in 08/2016.  . Bilateral lower extremity edema 06/14/2020  . Chronic pain 11/25/2019  . DDD (degenerative disc disease), lumbar 10/24/2019  . Dyspnea on exertion 12/29/2019  . Gallstone 10/11/2015  . GERD (gastroesophageal reflux disease)   . Hiatal hernia 08/12/2016  . High blood cholesterol   . High risk medication use 08/13/2016  . History of pulmonary embolism 12/29/2019  . Hyperlipemia 12/24/2016  . Hypertensive crisis 05/01/2020  . Iron deficiency anemia 08/12/2016  . Lumbar facet arthropathy 10/24/2019  . Malaise and fatigue 08/13/2016  . Morbid obesity (HCC)  11/25/2019  . Obesity (BMI 30-39.9) 08/13/2016  . Prediabetes 08/12/2016  . Primary osteoarthritis of right knee 09/16/2019  . Pulmonary emboli (HCC) 11/25/2019  . Pulmonary embolism (HCC)   . Pulmonary embolism and infarction (HCC) 11/25/2019  . S/P arthroscopic surgery of right knee 03/13/2020  . Tear of medial meniscus of right knee 08/26/2019  . Vitamin B12 deficiency 08/21/2016    Past Surgical History:  Procedure Laterality Date  . ABDOMINAL HYSTERECTOMY    . CHOLECYSTECTOMY    . KNEE ARTHROPLASTY      Current Medications: Current Meds  Medication Sig  . albuterol (ACCUNEB) 0.63 MG/3ML nebulizer solution Take 3 mLs by nebulization as directed.  Marland Kitchen amLODipine (NORVASC) 5 MG tablet Take 0.5 mg by mouth daily.  Marland Kitchen atorvastatin (LIPITOR) 10 MG tablet Take 10 mg by mouth daily.  . furosemide (LASIX) 20 MG tablet Take 20 mg by mouth as needed for edema.  . hydroxychloroquine (PLAQUENIL) 200 MG tablet Take 400 mg by mouth daily.  Marland Kitchen losartan (COZAAR) 50 MG tablet Take 2 tablets by mouth daily. For 30 days  . omeprazole (PRILOSEC OTC) 20 MG tablet Take 20 mg by mouth daily.  Carlena Hurl 20 MG TABS tablet Take 20 mg by mouth daily.     Allergies:   Patient has no known allergies.   Social History   Socioeconomic History  . Marital status: Married    Spouse name: Not on file  . Number of children: Not on file  . Years of education: Not on file  . Highest education  level: Not on file  Occupational History  . Not on file  Tobacco Use  . Smoking status: Former Smoker    Years: 15.00    Types: Cigarettes  . Smokeless tobacco: Never Used  Vaping Use  . Vaping Use: Never used  Substance and Sexual Activity  . Alcohol use: Not Currently  . Drug use: Never  . Sexual activity: Not on file  Other Topics Concern  . Not on file  Social History Narrative  . Not on file   Social Determinants of Health   Financial Resource Strain: Not on file  Food Insecurity: Not on file   Transportation Needs: Not on file  Physical Activity: Not on file  Stress: Not on file  Social Connections: Not on file     Family History: The patient's family history is not on file. ROS:   Please see the history of present illness.    All 14 point review of systems negative except as described per history of present illness  EKGs/Labs/Other Studies Reviewed:      Recent Labs: 11/26/2019: ALT 14 11/27/2019: Hemoglobin 11.9; Magnesium 1.9; Platelets 219 07/31/2020: BUN 11; Creatinine, Ser 0.60; Potassium 3.7; Sodium 144  Recent Lipid Panel    Component Value Date/Time   CHOL 179 11/27/2019 0216   TRIG 88 11/27/2019 0216   HDL 50 11/27/2019 0216   CHOLHDL 3.6 11/27/2019 0216   VLDL 18 11/27/2019 0216   LDLCALC 111 (H) 11/27/2019 0216    Physical Exam:    VS:  BP 116/82 (BP Location: Right Arm, Patient Position: Sitting)   Pulse 90   Ht 5\' 9"  (1.753 m)   Wt 232 lb (105.2 kg)   SpO2 95%   BMI 34.26 kg/m     Wt Readings from Last 3 Encounters:  11/02/20 232 lb (105.2 kg)  07/31/20 247 lb (112 kg)  12/29/19 240 lb 12.8 oz (109.2 kg)     GEN:  Well nourished, well developed in no acute distress HEENT: Normal NECK: No JVD; No carotid bruits LYMPHATICS: No lymphadenopathy CARDIAC: RRR, no murmurs, no rubs, no gallops RESPIRATORY:  Clear to auscultation without rales, wheezing or rhonchi  ABDOMEN: Soft, non-tender, non-distended MUSCULOSKELETAL:  No edema; No deformity  SKIN: Warm and dry LOWER EXTREMITIES: no swelling NEUROLOGIC:  Alert and oriented x 3 PSYCHIATRIC:  Normal affect   ASSESSMENT:    1. History of pulmonary embolism   2. Prediabetes   3. Dyspnea on exertion   4. Mixed hyperlipidemia    PLAN:    In order of problems listed above:  1. History of pulmonary emboli she is already scheduled for echocardiogram awaiting for results of it to make a decision about what to do next.  I suspect she will be referred to pulmonary as well because having  ongoing difficulty breathing if I would not see any explanation from heart point of view for it which she will be referred to pulmonary. 2. Prediabetes followed by internal medicine team. 3. Dyspnea on exertion multifactorial plan as outlined above. 4. Mixed dyslipidemia so far no indication for aggressive therapy will continue monitoring. 5. Claudications which is a new diagnosis.  I will ask her to have arterial duplex evaluation of lower extremities.   Medication Adjustments/Labs and Tests Ordered: Current medicines are reviewed at length with the patient today.  Concerns regarding medicines are outlined above.  No orders of the defined types were placed in this encounter.  Medication changes: No orders of the defined types were placed  in this encounter.   Signed, Georgeanna Lea, MD, [facility] 11/02/2020 4:42 PM    [facility]

## 2020-11-02 NOTE — Addendum Note (Signed)
Addended by: Reynolds Bowl on: 11/02/2020 04:58 PM   Modules accepted: Orders

## 2020-11-02 NOTE — Patient Instructions (Signed)
Medication Instructions:  Your physician recommends that you continue on your current medications as directed. Please refer to the Current Medication list given to you today.  *If you need a refill on your cardiac medications before your next appointment, please call your pharmacy*   Lab Work: None If you have labs (blood work) drawn today and your tests are completely normal, you will receive your results only by: Marland Kitchen MyChart Message (if you have MyChart) OR . A paper copy in the mail If you have any lab test that is abnormal or we need to change your treatment, we will call you to review the results.   Testing/Procedures: Your physician has requested that you have a lower extremity arterial duplex. This test is an ultrasound of the arteries in the legs. It looks at arterial blood flow in the legs. Allow one hour for Lower Arterial scans. There are no restrictions or special instructions    Follow-Up: At [facility], you and your health needs are our priority.  As part of our continuing mission to provide you with exceptional heart care, we have created designated Provider Care Teams.  These Care Teams include your primary Cardiologist (physician) and Advanced Practice Providers (APPs -  Physician Assistants and Nurse Practitioners) who all work together to provide you with the care you need, when you need it.  We recommend signing up for the patient portal called "MyChart".  Sign up information is provided on this After Visit Summary.  MyChart is used to connect with patients for Virtual Visits (Telemedicine).  Patients are able to view lab/test results, encounter notes, upcoming appointments, etc.  Non-urgent messages can be sent to your provider as well.   To learn more about what you can do with MyChart, go to [email or web address].    Your next appointment:   5 month(s)  The format for your next appointment:   In Person  Provider:   Gypsy Balsam, MD   Other  Instructions

## 2020-11-26 ENCOUNTER — Ambulatory Visit (INDEPENDENT_AMBULATORY_CARE_PROVIDER_SITE_OTHER): Payer: Commercial Managed Care - PPO

## 2020-11-26 ENCOUNTER — Other Ambulatory Visit: Payer: Self-pay | Admitting: Cardiology

## 2020-11-26 ENCOUNTER — Other Ambulatory Visit: Payer: Self-pay

## 2020-11-26 DIAGNOSIS — Z86711 Personal history of pulmonary embolism: Secondary | ICD-10-CM | POA: Diagnosis not present

## 2020-11-26 DIAGNOSIS — R0609 Other forms of dyspnea: Secondary | ICD-10-CM

## 2020-11-26 DIAGNOSIS — I2699 Other pulmonary embolism without acute cor pulmonale: Secondary | ICD-10-CM

## 2020-11-26 DIAGNOSIS — R0600 Dyspnea, unspecified: Secondary | ICD-10-CM

## 2020-11-26 DIAGNOSIS — E785 Hyperlipidemia, unspecified: Secondary | ICD-10-CM

## 2020-11-26 LAB — ECHOCARDIOGRAM COMPLETE
Area-P 1/2: 6.83 cm2
Calc EF: 50.9 %
S' Lateral: 4.3 cm
Single Plane A2C EF: 49.9 %
Single Plane A4C EF: 53 %

## 2020-11-26 NOTE — Progress Notes (Signed)
Complete echocardiogram performed.  Jimmy Collin Rengel RDCS, RVT  

## 2020-11-29 ENCOUNTER — Telehealth: Payer: Self-pay | Admitting: Cardiology

## 2020-11-29 NOTE — Telephone Encounter (Signed)
Patient returning a call from our office to discuss test results. Please call back  

## 2020-11-29 NOTE — Telephone Encounter (Signed)
Patient informed of results.  

## 2020-12-10 ENCOUNTER — Ambulatory Visit (INDEPENDENT_AMBULATORY_CARE_PROVIDER_SITE_OTHER): Payer: Commercial Managed Care - PPO

## 2020-12-10 ENCOUNTER — Telehealth: Payer: Self-pay | Admitting: Cardiology

## 2020-12-10 ENCOUNTER — Other Ambulatory Visit: Payer: Self-pay

## 2020-12-10 DIAGNOSIS — I739 Peripheral vascular disease, unspecified: Secondary | ICD-10-CM

## 2020-12-10 DIAGNOSIS — I2609 Other pulmonary embolism with acute cor pulmonale: Secondary | ICD-10-CM

## 2020-12-10 NOTE — Progress Notes (Addendum)
Lower extremity arterial duplex exam performed..  Jimmy Marlita Keil RDCS, RVT  

## 2020-12-10 NOTE — Telephone Encounter (Signed)
Patient states she is supposed to be referred to a Pulmonologist and would like to know if it has been sent.

## 2020-12-11 NOTE — Telephone Encounter (Signed)
Called patient. She reports she thought Dr. Bing Matter was going to place referral. Explained to her that his note didn't directly say that but I am waiting for him to tell me for sure. She understood no further questions at this time. Will call patient back when Dr. Bing Matter responds.

## 2020-12-11 NOTE — Telephone Encounter (Signed)
I read over his note. When he says "suspect she will be referred to pulmonology" is that him saying her wants the referred? I do not remember if I sent the referral or not. If I was told about it I would have sent it. I hope this helps.

## 2020-12-11 NOTE — Telephone Encounter (Signed)
Referral was placed earlier today. Patient informed.

## 2020-12-11 NOTE — Telephone Encounter (Signed)
History of pulmonary emboli she is already scheduled for echocardiogram awaiting for results of it to make a decision about what to do next.  I suspect she will be referred to pulmonary as well because having ongoing difficulty breathing if I would not see any explanation from heart point of view for it which she will be referred to pulmonary.   Looking at this portion of Dr. Charm Rings notes it looks like he was waiting for the results of the patient's echo. Her echo wasn't preformed until 11/26/2020 I discharged her on 11/02/2020. I see you and Candice called her to give her the results. Did Dr. Kirtland Bouchard ever tell you he wanted the pulmonary referral? He did not say anything to me about it. I just wanted to make sure we are on the same page.

## 2020-12-11 NOTE — Telephone Encounter (Signed)
Left message for patient to return call.

## 2020-12-11 NOTE — Telephone Encounter (Signed)
Yes, she to have a referral to pulmonary

## 2020-12-11 NOTE — Telephone Encounter (Signed)
Patient returning call.

## 2020-12-11 NOTE — Telephone Encounter (Signed)
Perfect. Thank you!

## 2020-12-14 ENCOUNTER — Telehealth: Payer: Self-pay | Admitting: Cardiology

## 2020-12-14 NOTE — Telephone Encounter (Signed)
    Pt is calling back and would like to speak with Hayley again. She said it's about their previous conversation

## 2020-12-14 NOTE — Telephone Encounter (Signed)
Called patient back went over results of her lower extremity ultrasound again with her.

## 2021-01-04 ENCOUNTER — Ambulatory Visit (INDEPENDENT_AMBULATORY_CARE_PROVIDER_SITE_OTHER): Payer: Commercial Managed Care - PPO | Admitting: Pulmonary Disease

## 2021-01-04 ENCOUNTER — Other Ambulatory Visit: Payer: Self-pay

## 2021-01-04 ENCOUNTER — Encounter: Payer: Self-pay | Admitting: Pulmonary Disease

## 2021-01-04 VITALS — BP 134/78 | HR 74 | Temp 98.1°F | Ht 68.0 in | Wt 236.2 lb

## 2021-01-04 DIAGNOSIS — R0600 Dyspnea, unspecified: Secondary | ICD-10-CM | POA: Diagnosis not present

## 2021-01-04 DIAGNOSIS — R0609 Other forms of dyspnea: Secondary | ICD-10-CM

## 2021-01-04 DIAGNOSIS — I2699 Other pulmonary embolism without acute cor pulmonale: Secondary | ICD-10-CM | POA: Diagnosis not present

## 2021-01-04 NOTE — Progress Notes (Signed)
Marshall Roehrich    [id number]    March 13, 1966  Primary Care Physician:Goins, Gwenith Spitz, FNP  Referring Physician: Georgeanna Lea, MD [address] [place],  [place] [address]  Chief complaint: Consult for PE  HPI: 55 year old with prior PE after orthopedic surgery and 2017, GERD, hyperlipidemia, prediabetes, morbid obesity. Had a right knee arthroscopy in 10/30/2020 presented with bilateral PE with right heart strain, acute DVT.  She was transferred from [place] to [facility]. Treated with anticoagulation and did not require lytics or IR intervention.  Post discharge she continues to have significant dyspnea on exertion She has been evaluated by cardiology with echocardiogram showing no evidence of pulmonary hypertension  Pets: Dog Occupation: Housewife Exposures: No mold, hot tub, Jacuzzi.  No feather pillows or comforters Smoking history: 5 pack year smoker.  Quit in 2006 Travel history: No significant travel history Relevant family history: Mother and brother had COPD.  They were smokers.   Outpatient Encounter Medications as of 01/04/2021  Medication Sig  . albuterol (ACCUNEB) 0.63 MG/3ML nebulizer solution Take 3 mLs by nebulization as directed.  Marland Kitchen amLODipine (NORVASC) 5 MG tablet Take 0.5 mg by mouth daily.  Marland Kitchen atorvastatin (LIPITOR) 10 MG tablet Take 10 mg by mouth daily.  . furosemide (LASIX) 20 MG tablet Take 20 mg by mouth as needed for edema.  . hydroxychloroquine (PLAQUENIL) 200 MG tablet Take 400 mg by mouth daily.  Marland Kitchen omeprazole (PRILOSEC OTC) 20 MG tablet Take 20 mg by mouth daily.  Carlena Hurl 20 MG TABS tablet Take 20 mg by mouth daily.  Marland Kitchen losartan (COZAAR) 50 MG tablet Take 2 tablets by mouth daily. For 30 days   No facility-administered encounter medications on file as of 01/04/2021.    Allergies as of 01/04/2021  . (No Known Allergies)    Past Medical History:  Diagnosis Date  . Abdominal pain 10/11/2015  . Aortic atherosclerosis (HCC) 09/27/2016    Formatting of this note might be different from the original. Seen on chest x-ray in 08/2016.  . Bilateral lower extremity edema 06/14/2020  . Chronic pain 11/25/2019  . DDD (degenerative disc disease), lumbar 10/24/2019  . Dyspnea on exertion 12/29/2019  . Gallstone 10/11/2015  . GERD (gastroesophageal reflux disease)   . Hiatal hernia 08/12/2016  . High blood cholesterol   . High risk medication use 08/13/2016  . History of pulmonary embolism 12/29/2019  . Hyperlipemia 12/24/2016  . Hypertensive crisis 05/01/2020  . Iron deficiency anemia 08/12/2016  . Lumbar facet arthropathy 10/24/2019  . Malaise and fatigue 08/13/2016  . Morbid obesity (HCC) 11/25/2019  . Obesity (BMI 30-39.9) 08/13/2016  . Prediabetes 08/12/2016  . Primary osteoarthritis of right knee 09/16/2019  . Pulmonary emboli (HCC) 11/25/2019  . Pulmonary embolism (HCC)   . Pulmonary embolism and infarction (HCC) 11/25/2019  . S/P arthroscopic surgery of right knee 03/13/2020  . Tear of medial meniscus of right knee 08/26/2019  . Vitamin B12 deficiency 08/21/2016    Past Surgical History:  Procedure Laterality Date  . ABDOMINAL HYSTERECTOMY    . CHOLECYSTECTOMY    . KNEE ARTHROPLASTY      No family history on file.  Social History   Socioeconomic History  . Marital status: Married    Spouse name: Not on file  . Number of children: Not on file  . Years of education: Not on file  . Highest education level: Not on file  Occupational History  . Not on file  Tobacco Use  . Smoking status: Former Smoker    Years: 15.00    Types: Cigarettes  . Smokeless tobacco: Never Used  Vaping Use  . Vaping Use: Never used  Substance and Sexual Activity  . Alcohol use: Not Currently  . Drug use: Never  . Sexual activity: Not on file  Other Topics Concern  . Not on file  Social History Narrative  . Not on file   Social Determinants of Health   Financial Resource Strain: Not on file  Food Insecurity: Not on file   Transportation Needs: Not on file  Physical Activity: Not on file  Stress: Not on file  Social Connections: Not on file  Intimate Partner Violence: Not on file    Review of systems: Review of Systems  Constitutional: Negative for fever and chills.  HENT: Negative.   Eyes: Negative for blurred vision.  Respiratory: as per HPI  Cardiovascular: Negative for chest pain and palpitations.  Gastrointestinal: Negative for vomiting, diarrhea, blood per rectum. Genitourinary: Negative for dysuria, urgency, frequency and hematuria.  Musculoskeletal: Negative for myalgias, back pain and joint pain.  Skin: Negative for itching and rash.  Neurological: Negative for dizziness, tremors, focal weakness, seizures and loss of consciousness.  Endo/Heme/Allergies: Negative for environmental allergies.  Psychiatric/Behavioral: Negative for depression, suicidal ideas and hallucinations.  All other systems reviewed and are negative.  Physical Exam: Blood pressure 134/78, pulse 74, temperature 98.1 F (36.7 C), temperature source Temporal, height 5\' 8"  (1.727 m), weight 236 lb 3.2 oz (107.1 kg), SpO2 95 %. Gen:      No acute distress HEENT:  EOMI, sclera anicteric Neck:     No masses; no thyromegaly Lungs:    Clear to auscultation bilaterally; normal respiratory effort CV:         Regular rate and rhythm; no murmurs Abd:      + bowel sounds; soft, non-tender; no palpable masses, no distension Ext:    No edema; adequate peripheral perfusion Skin:      Warm and dry; no rash Neuro: alert and oriented x 3 Psych: normal mood and affect  Data Reviewed: Imaging: [facility] Feb 2022 Acute bilateral pulmonary emboli, positive for acute PE with CT evidence of right heart strain (RV/LV ratio = 1.04) consistent with at least submassive (intermediate risk) PE.  Lower extremity ultrasound 11/26/2019-acute DVT involving right popliteal and right posterior tibial  vein.  PFTs:  Labs:  Cardiac: Echocardiogram [facility] Feb 2022 Grossly normal LV size and systolic function. Unable to comment on regional wall motion. Diastolic filling pattern indicates impaired relaxation. RV was not well visualized. Left atrium normal in size. Mild to moderate tricuspid regurgitation present. RVSP 44 mmHg.  Echocardiogram 11/18/2020-LVEF 50-55%, normal RV systolic size and function.  Normal PA systolic pressure.  Assessment:  Recurrent PE Evaluation for dyspnea Continues to have dyspnea after her last episode of PE.  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

## 2021-01-04 NOTE — Patient Instructions (Signed)
I have reviewed the ultrasound of the heart which does not show any significant heart abnormality We will schedule PFTs for further evaluation of the lung in the next 1 to 2 months We will refer you to cardiopulmonary rehab at [facility]  Follow-up after pulmonary function tests on the same day for review and plan for next steps

## 2021-01-14 ENCOUNTER — Encounter: Payer: Self-pay | Admitting: Cardiology

## 2021-01-14 ENCOUNTER — Other Ambulatory Visit: Payer: Self-pay

## 2021-01-14 ENCOUNTER — Ambulatory Visit (INDEPENDENT_AMBULATORY_CARE_PROVIDER_SITE_OTHER): Payer: Commercial Managed Care - PPO | Admitting: Cardiology

## 2021-01-14 VITALS — BP 156/92 | HR 71 | Ht 68.0 in | Wt 236.0 lb

## 2021-01-14 DIAGNOSIS — R0600 Dyspnea, unspecified: Secondary | ICD-10-CM | POA: Diagnosis not present

## 2021-01-14 DIAGNOSIS — R600 Localized edema: Secondary | ICD-10-CM

## 2021-01-14 DIAGNOSIS — Z86711 Personal history of pulmonary embolism: Secondary | ICD-10-CM | POA: Diagnosis not present

## 2021-01-14 DIAGNOSIS — E782 Mixed hyperlipidemia: Secondary | ICD-10-CM

## 2021-01-14 DIAGNOSIS — R0609 Other forms of dyspnea: Secondary | ICD-10-CM

## 2021-01-14 NOTE — Patient Instructions (Signed)
Medication Instructions:  Your physician recommends that you continue on your current medications as directed. Please refer to the Current Medication list given to you today.  *If you need a refill on your cardiac medications before your next appointment, please call your pharmacy*   Lab Work: Your physician recommends that you return for lab work today: lipid  If you have labs (blood work) drawn today and your tests are completely normal, you will receive your results only by: . MyChart Message (if you have MyChart) OR . A paper copy in the mail If you have any lab test that is abnormal or we need to change your treatment, we will call you to review the results.   Testing/Procedures: None   Follow-Up: At [facility], you and your health needs are our priority.  As part of our continuing mission to provide you with exceptional heart care, we have created designated Provider Care Teams.  These Care Teams include your primary Cardiologist (physician) and Advanced Practice Providers (APPs -  Physician Assistants and Nurse Practitioners) who all work together to provide you with the care you need, when you need it.  We recommend signing up for the patient portal called "MyChart".  Sign up information is provided on this After Visit Summary.  MyChart is used to connect with patients for Virtual Visits (Telemedicine).  Patients are able to view lab/test results, encounter notes, upcoming appointments, etc.  Non-urgent messages can be sent to your provider as well.   To learn more about what you can do with MyChart, go to [email or web address].    Your next appointment:   3 month(s)  The format for your next appointment:   In Person  Provider:   Robert Krasowski, MD   Other Instructions   

## 2021-01-14 NOTE — Progress Notes (Signed)
Cardiology Office Note:    Date:  01/14/2021   ID:  Meagan Doyle, DOB 12/22/1965, MRN [id number]  PCP:  Audie Pinto, FNP  Cardiologist:  Gypsy Balsam, MD    Referring MD: Audie Pinto, FNP   Chief Complaint  Patient presents with  . Results  I am doing fine still short of breath  History of Present Illness:    Meagan Doyle is a 55 y.o. female with past medical history significant of pulmonary emboli as well as DVT initially she did have pulm emboli after cholecystectomy however after that she had knee surgery and that having pulmonary emboli again with pulmonary infarct she did have evidence of right ventricular strain, at that time pulmonary pressure was 44 mmHg and she eventually and the being transferred to [facility] for potentially more aggressive management.  Likely she did not require that.  I did see her about 2 months ago when she was coming to me complain of having some exertional shortness of breath.  There was no chest pain tightness squeezing pressure burning chest chest exertional shortness of breath. Today she comes to my office is doing okay still shortness of breath is present.  She was seen by pulmonary and pulmonary function tests is scheduled.  Past Medical History:  Diagnosis Date  . Abdominal pain 10/11/2015  . Aortic atherosclerosis (HCC) 09/27/2016   Formatting of this note might be different from the original. Seen on chest x-ray in 08/2016.  . Bilateral lower extremity edema 06/14/2020  . Chronic pain 11/25/2019  . DDD (degenerative disc disease), lumbar 10/24/2019  . Dyspnea on exertion 12/29/2019  . Gallstone 10/11/2015  . GERD (gastroesophageal reflux disease)   . Hiatal hernia 08/12/2016  . High blood cholesterol   . High risk medication use 08/13/2016  . History of pulmonary embolism 12/29/2019  . Hyperlipemia 12/24/2016  . Hypertensive crisis 05/01/2020  . Iron deficiency anemia 08/12/2016  . Lumbar facet arthropathy 10/24/2019  . Malaise  and fatigue 08/13/2016  . Morbid obesity (HCC) 11/25/2019  . Obesity (BMI 30-39.9) 08/13/2016  . Prediabetes 08/12/2016  . Primary osteoarthritis of right knee 09/16/2019  . Pulmonary emboli (HCC) 11/25/2019  . Pulmonary embolism (HCC)   . Pulmonary embolism and infarction (HCC) 11/25/2019  . S/P arthroscopic surgery of right knee 03/13/2020  . Tear of medial meniscus of right knee 08/26/2019  . Vitamin B12 deficiency 08/21/2016    Past Surgical History:  Procedure Laterality Date  . ABDOMINAL HYSTERECTOMY    . CHOLECYSTECTOMY    . KNEE ARTHROPLASTY      Current Medications: Current Meds  Medication Sig  . albuterol (ACCUNEB) 0.63 MG/3ML nebulizer solution Take 3 mLs by nebulization as directed.  Marland Kitchen amLODipine (NORVASC) 5 MG tablet Take 0.5 mg by mouth daily.  Marland Kitchen atorvastatin (LIPITOR) 10 MG tablet Take 10 mg by mouth daily.  . furosemide (LASIX) 20 MG tablet Take 20 mg by mouth as needed for edema.  . hydroxychloroquine (PLAQUENIL) 200 MG tablet Take 400 mg by mouth daily.  Marland Kitchen losartan (COZAAR) 50 MG tablet Take 2 tablets by mouth daily. For 30 days  . omeprazole (PRILOSEC OTC) 20 MG tablet Take 20 mg by mouth daily.  Carlena Hurl 20 MG TABS tablet Take 20 mg by mouth daily.     Allergies:   Patient has no known allergies.   Social History   Socioeconomic History  . Marital status: Married    Spouse name: Not on file  . Number of  children: Not on file  . Years of education: Not on file  . Highest education level: Not on file  Occupational History  . Not on file  Tobacco Use  . Smoking status: Former Smoker    Years: 15.00    Types: Cigarettes  . Smokeless tobacco: Never Used  Vaping Use  . Vaping Use: Never used  Substance and Sexual Activity  . Alcohol use: Not Currently  . Drug use: Never  . Sexual activity: Not on file  Other Topics Concern  . Not on file  Social History Narrative  . Not on file   Social Determinants of Health   Financial Resource Strain: Not  on file  Food Insecurity: Not on file  Transportation Needs: Not on file  Physical Activity: Not on file  Stress: Not on file  Social Connections: Not on file     Family History: The patient's family history is not on file. ROS:   Please see the history of present illness.    All 14 point review of systems negative except as described per history of present illness  EKGs/Labs/Other Studies Reviewed:      Recent Labs: 07/31/2020: BUN 11; Creatinine, Ser 0.60; Potassium 3.7; Sodium 144  Recent Lipid Panel    Component Value Date/Time   CHOL 179 11/27/2019 0216   TRIG 88 11/27/2019 0216   HDL 50 11/27/2019 0216   CHOLHDL 3.6 11/27/2019 0216   VLDL 18 11/27/2019 0216   LDLCALC 111 (H) 11/27/2019 0216    Physical Exam:    VS:  BP (!) 156/92 (BP Location: Right Arm, Patient Position: Sitting)   Pulse 71   Ht 5\' 8"  (1.727 m)   Wt 236 lb (107 kg)   SpO2 98%   BMI 35.88 kg/m     Wt Readings from Last 3 Encounters:  01/14/21 236 lb (107 kg)  01/04/21 236 lb 3.2 oz (107.1 kg)  11/02/20 232 lb (105.2 kg)     GEN:  Well nourished, well developed in no acute distress HEENT: Normal NECK: No JVD; No carotid bruits LYMPHATICS: No lymphadenopathy CARDIAC: RRR, no murmurs, no rubs, no gallops RESPIRATORY:  Clear to auscultation without rales, wheezing or rhonchi  ABDOMEN: Soft, non-tender, non-distended MUSCULOSKELETAL:  No edema; No deformity  SKIN: Warm and dry LOWER EXTREMITIES: no swelling NEUROLOGIC:  Alert and oriented x 3 PSYCHIATRIC:  Normal affect   ASSESSMENT:    1. Bilateral lower extremity edema   2. Dyspnea on exertion   3. History of pulmonary embolism    PLAN:    In order of problems listed above:  1. Bilateral lower extremities edema only minimal today on physical examination I suspect the reason for her symptomatology and lower extremities is the fact that she had DVT in some swelling after that.  I did arterial duplex evaluation of lower extremities  and multiple stenosis at different levels but none of this is hemodynamically obstructive none of this is responsible for her symptoms.  The key management for this problem is risk factors modifications. 2. Dyspnea on exertion multifactorial obesity pulmonary function test will be tremendously beneficial, she may require evaluation for coronary artery disease in the future to make sure she does not have anginal equivalent. 3. History of pulmonary emboli she is anticoagulated which I will continue. 4. Essential hypertension: Blood pressure elevated today but she said she checked it at home is always less than 130 systolic.  Therefore we will continue amlodipine because her at present dose. 5. Dyslipidemia,  I will check her fasting lipid profile today now with her known peripheral vascular disease we need to make sure her cholesterol is reduced to LDL less than 70.   Medication Adjustments/Labs and Tests Ordered: Current medicines are reviewed at length with the patient today.  Concerns regarding medicines are outlined above.  No orders of the defined types were placed in this encounter.  Medication changes: No orders of the defined types were placed in this encounter.   Signed, Georgeanna Lea, MD, [facility] 01/14/2021 3:48 PM    [facility]

## 2021-01-14 NOTE — Addendum Note (Signed)
Addended by: Hazle Quant on: 01/14/2021 03:55 PM   Modules accepted: Orders

## 2021-01-15 ENCOUNTER — Telehealth: Payer: Self-pay | Admitting: Cardiology

## 2021-01-15 DIAGNOSIS — E782 Mixed hyperlipidemia: Secondary | ICD-10-CM

## 2021-01-15 LAB — LIPID PANEL
Chol/HDL Ratio: 2.9 ratio (ref 0.0–4.4)
Cholesterol, Total: 182 mg/dL (ref 100–199)
HDL: 62 mg/dL (ref >39)
LDL Chol Calc (NIH): 101 mg/dL — ABNORMAL HIGH (ref 0–99)
Triglycerides: 107 mg/dL (ref 0–149)
VLDL Cholesterol Cal: 19 mg/dL (ref 5–40)

## 2021-01-15 MED ORDER — ATORVASTATIN CALCIUM 10 MG PO TABS: 20.0000 mg | ORAL_TABLET | Freq: Every day | ORAL | 3 refills | Status: DC

## 2021-01-15 NOTE — Telephone Encounter (Signed)
Results reviewed with pt as per Dr. Krasowski's note.  Pt verbalized understanding and had no additional questions.   

## 2021-01-15 NOTE — Telephone Encounter (Signed)
° °  Pt is returning call to get lab result °

## 2021-03-05 ENCOUNTER — Other Ambulatory Visit (HOSPITAL_COMMUNITY)
Admission: RE | Admit: 2021-03-05 | Discharge: 2021-03-05 | Disposition: A | Discharge status: Home / Self Care | Payer: Commercial Managed Care - PPO | Source: Ambulatory Visit | Attending: Pulmonary Disease | Admitting: Pulmonary Disease

## 2021-03-05 DIAGNOSIS — Z01812 Encounter for preprocedural laboratory examination: Secondary | ICD-10-CM | POA: Insufficient documentation

## 2021-03-05 DIAGNOSIS — Z20822 Contact with and (suspected) exposure to covid-19: Secondary | ICD-10-CM | POA: Insufficient documentation

## 2021-03-05 LAB — SARS CORONAVIRUS 2 (TAT 6-24 HRS): SARS Coronavirus 2: NEGATIVE

## 2021-03-08 ENCOUNTER — Other Ambulatory Visit: Payer: Self-pay

## 2021-03-08 ENCOUNTER — Encounter: Payer: Self-pay | Admitting: *Deleted

## 2021-03-08 ENCOUNTER — Ambulatory Visit (INDEPENDENT_AMBULATORY_CARE_PROVIDER_SITE_OTHER): Payer: Commercial Managed Care - PPO

## 2021-03-08 ENCOUNTER — Encounter: Payer: Self-pay | Admitting: Primary Care

## 2021-03-08 ENCOUNTER — Ambulatory Visit (INDEPENDENT_AMBULATORY_CARE_PROVIDER_SITE_OTHER): Payer: Commercial Managed Care - PPO | Admitting: Primary Care

## 2021-03-08 ENCOUNTER — Ambulatory Visit (INDEPENDENT_AMBULATORY_CARE_PROVIDER_SITE_OTHER): Payer: Commercial Managed Care - PPO | Admitting: Pulmonary Disease

## 2021-03-08 VITALS — BP 122/74 | HR 81 | Temp 97.5°F | Ht 68.5 in | Wt 237.0 lb

## 2021-03-08 DIAGNOSIS — R0609 Other forms of dyspnea: Secondary | ICD-10-CM

## 2021-03-08 DIAGNOSIS — R0600 Dyspnea, unspecified: Secondary | ICD-10-CM

## 2021-03-08 DIAGNOSIS — R053 Chronic cough: Secondary | ICD-10-CM

## 2021-03-08 HISTORY — DX: Chronic cough: R05.3

## 2021-03-08 LAB — POCT EXHALED NITRIC OXIDE: FeNO level (ppb): 7

## 2021-03-08 LAB — PULMONARY FUNCTION TEST
DL/VA % pred: 100 %
DL/VA: 4.15 ml/min/mmHg/L
DLCO cor % pred: 88 %
DLCO cor: 21.26 ml/min/mmHg
DLCO unc % pred: 88 %
DLCO unc: 21.26 ml/min/mmHg
FEF 25-75 Post: 2.58 L/sec
FEF 25-75 Pre: 2.97 L/sec
FEF2575-%Change-Post: -13 %
FEF2575-%Pred-Post: 89 %
FEF2575-%Pred-Pre: 103 %
FEV1-%Change-Post: -2 %
FEV1-%Pred-Post: 79 %
FEV1-%Pred-Pre: 82 %
FEV1-Post: 2.52 L
FEV1-Pre: 2.59 L
FEV1FVC-%Change-Post: 0 %
FEV1FVC-%Pred-Pre: 105 %
FEV6-%Change-Post: -2 %
FEV6-%Pred-Post: 77 %
FEV6-%Pred-Pre: 79 %
FEV6-Post: 3.01 L
FEV6-Pre: 3.1 L
FEV6FVC-%Pred-Post: 103 %
FEV6FVC-%Pred-Pre: 103 %
FVC-%Change-Post: -2 %
FVC-%Pred-Post: 74 %
FVC-%Pred-Pre: 77 %
FVC-Post: 3.01 L
FVC-Pre: 3.1 L
Post FEV1/FVC ratio: 84 %
Post FEV6/FVC ratio: 100 %
Pre FEV1/FVC ratio: 84 %
Pre FEV6/FVC Ratio: 100 %
RV % pred: 53 %
RV: 1.13 L
TLC % pred: 77 %
TLC: 4.46 L

## 2021-03-08 LAB — BRAIN NATRIURETIC PEPTIDE: Pro B Natriuretic peptide (BNP): 65 pg/mL (ref 0.0–100.0)

## 2021-03-08 LAB — CBC WITH DIFFERENTIAL/PLATELET
Basophils Absolute: 0 10*3/uL (ref 0.0–0.1)
Basophils Relative: 0.6 % (ref 0.0–3.0)
Eosinophils Absolute: 0.2 10*3/uL (ref 0.0–0.7)
Eosinophils Relative: 3.5 % (ref 0.0–5.0)
HCT: 37.2 % (ref 36.0–46.0)
Hemoglobin: 12.5 g/dL (ref 12.0–15.0)
Lymphocytes Relative: 36.3 % (ref 12.0–46.0)
Lymphs Abs: 2 10*3/uL (ref 0.7–4.0)
MCHC: 33.5 g/dL (ref 30.0–36.0)
MCV: 85.9 fl (ref 78.0–100.0)
Monocytes Absolute: 0.3 10*3/uL (ref 0.1–1.0)
Monocytes Relative: 4.7 % (ref 3.0–12.0)
Neutro Abs: 3.1 10*3/uL (ref 1.4–7.7)
Neutrophils Relative %: 54.9 % (ref 43.0–77.0)
Platelets: 263 10*3/uL (ref 150.0–400.0)
RBC: 4.33 Mil/uL (ref 3.87–5.11)
RDW: 14.6 % (ref 11.5–15.5)
WBC: 5.6 10*3/uL (ref 4.0–10.5)

## 2021-03-08 NOTE — Assessment & Plan Note (Addendum)
-   Persistent DOE since PE in March 2021, she is maintained on lifelong Eliquis  - No evidence of COPD or asthma of pulmonary function testing. Minimal restriction with decreased ERV. No BD response.  - Checking CXR today and FENO - Encourage weight loss efforts, aim 15-30 lbs - She is unable to attend cardiopulmonary rehabd d/t cost, advised she get 20-30 mins regular cardiovascular exercise

## 2021-03-08 NOTE — Progress Notes (Signed)
Full PFT performed today. °

## 2021-03-08 NOTE — Progress Notes (Signed)
Labs including CBC and BNP were normal

## 2021-03-08 NOTE — Assessment & Plan Note (Signed)
-   Chronic dry cough, GERD or PND symptoms could likely be contributing  - Checking CXR and CBC with diff - Advised she increase PPI to BID and start Flonase nasal spray

## 2021-03-08 NOTE — Patient Instructions (Addendum)
Recommendations: - Increase Prilosec 20mg  twice daily   - Start Flonase nasal spray 1 puff per nostril once daily  - Work on weight loss efforts, aim 15-30 lbs - Aim to get 20-30 mins cardiopulmonary exercise 3-5 months   Orders: - CXR today  - FENO re: cough  Follow-up: - Mid- to end of July with Dr. August

## 2021-03-08 NOTE — Patient Instructions (Signed)
Full PFT performed today. °

## 2021-03-08 NOTE — Progress Notes (Signed)
@Patient  ID: , female    DOB: 19-Jan-1966, 55 y.o.   MRN: [id number]  Chief Complaint  Patient presents with   Follow-up    Dry cough for 2-3 weeks.     Referring provider: [id number], FNP  HPI: 55 year old female, former smoker quit in 2003.  Past medical history significant for pulmonary embolism with infarction, hypertensive crisis, aortic arthrosclerosis, GERD, per lipidemia, prediabetes, obesity.  Patient of Dr. 2004, seen for initial consult on 01/04/2021 for PE and dyspnea.  Patient  had reported dyspnea after last episode of pulmonary embolism.  No evidence of pulmonary hypertension on echocardiogram.  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  Referred to cardiopulmonary rehab at [facility].  03/08/2021- interim hx  Patient presents today for a 39-month follow-up with PFTs.  She reports symptoms of intermittent dry cough for 2 to 3 weeks. She also experiences dyspnea symptoms walking level at her own pace or with inclines/stairs. She is not able to go to cardiopulmonary rehab d/t cost. She has some joint pain right shoulder. She follows with rheumatology and is on Plaquenil.  She also reports hair loss, she will follow-up with PCP regarding this. May benefit from dermatology consult and/or additional lab testing.     Pulmonary function testing: 03/08/2021 - FVC 3.01 (74%), 2.52 (79%), ratio 84, ERV 66%, TLC 77%, DLCOunc 88%   No Known Allergies   There is no immunization history on file for this patient.  Past Medical History:  Diagnosis Date   Abdominal pain 10/11/2015   Aortic atherosclerosis (HCC) 09/27/2016   Formatting of this note might be different from the original. Seen on chest x-ray in 08/2016.   Bilateral lower extremity edema 06/14/2020   Chronic pain 11/25/2019   DDD (degenerative disc disease), lumbar 10/24/2019   Dyspnea on exertion 12/29/2019   Gallstone 10/11/2015   GERD (gastroesophageal  reflux disease)    Hiatal hernia 08/12/2016   High blood cholesterol    High risk medication use 08/13/2016   History of pulmonary embolism 12/29/2019   Hyperlipemia 12/24/2016   Hypertensive crisis 05/01/2020   Iron deficiency anemia 08/12/2016   Lumbar facet arthropathy 10/24/2019   Malaise and fatigue 08/13/2016   Morbid obesity (HCC) 11/25/2019   Obesity (BMI 30-39.9) 08/13/2016   Prediabetes 08/12/2016   Primary osteoarthritis of right knee 09/16/2019   Pulmonary emboli (HCC) 11/25/2019   Pulmonary embolism (HCC)    Pulmonary embolism and infarction (HCC) 11/25/2019   S/P arthroscopic surgery of right knee 03/13/2020   Tear of medial meniscus of right knee 08/26/2019   Vitamin B12 deficiency 08/21/2016    Tobacco History: Social History   Tobacco Use  Smoking Status Former   Packs/day: 1.00   Years: 15.00   Pack years: 15.00   Types: Cigarettes   Quit date: 2003   Years since quitting: 19.4  Smokeless Tobacco Never   Counseling given: Not Answered   Outpatient Medications Prior to Visit  Medication Sig Dispense Refill   albuterol (ACCUNEB) 0.63 MG/3ML nebulizer solution Take 3 mLs by nebulization as directed.     amLODipine (NORVASC) 5 MG tablet Take 0.5 mg by mouth daily.     atorvastatin (LIPITOR) 10 MG tablet Take 2 tablets (20 mg total) by mouth daily. 90 tablet 3   furosemide (LASIX) 20 MG tablet Take 20 mg by mouth as needed for edema.     hydroxychloroquine (PLAQUENIL) 200 MG tablet Take 400 mg by mouth daily.  omeprazole (PRILOSEC OTC) 20 MG tablet Take 20 mg by mouth daily.     XARELTO 20 MG TABS tablet Take 20 mg by mouth daily.     losartan (COZAAR) 50 MG tablet Take 2 tablets by mouth daily. For 30 days     No facility-administered medications prior to visit.      Review of Systems  Review of Systems  Constitutional: Negative.   HENT: Negative.    Respiratory:  Positive for cough. Negative for chest tightness and wheezing.        Dyspnea on  exertion  Cardiovascular:  Positive for leg swelling.    Physical Exam  BP 122/74 (BP Location: Left Arm, Cuff Size: Normal)   Pulse 81   Temp (!) 97.5 F (36.4 C) (Temporal)   Ht 5' 8.5" (1.74 m)   Wt 237 lb (107.5 kg)   SpO2 99% Comment: RA  BMI 35.51 kg/m  Physical Exam Constitutional:      Appearance: Normal appearance. She is obese.  HENT:     Head: Normocephalic and atraumatic.  Cardiovascular:     Rate and Rhythm: Normal rate and regular rhythm.     Comments: Trace BLE edema, varicose veins  Pulmonary:     Effort: Pulmonary effort is normal.     Breath sounds: Normal breath sounds. No wheezing or rales.  Musculoskeletal:        General: Normal range of motion.  Skin:    General: Skin is warm and dry.  Neurological:     General: No focal deficit present.     Mental Status: She is alert and oriented to person, place, and time. Mental status is at baseline.  Psychiatric:        Mood and Affect: Mood normal.        Behavior: Behavior normal.        Thought Content: Thought content normal.        Judgment: Judgment normal.     Lab Results:  CBC    Component Value Date/Time   WBC 4.7 11/27/2019 0216   RBC 4.08 11/27/2019 0216   HGB 11.9 (L) 11/27/2019 0216   HCT 36.5 11/27/2019 0216   PLT 219 11/27/2019 0216   MCV 89.5 11/27/2019 0216   MCH 29.2 11/27/2019 0216   MCHC 32.6 11/27/2019 0216   RDW 14.4 11/27/2019 0216    BMET    Component Value Date/Time   NA 144 07/31/2020 1709   K 3.7 07/31/2020 1709   CL 104 07/31/2020 1709   CO2 23 07/31/2020 1709   GLUCOSE 75 07/31/2020 1709   GLUCOSE 133 (H) 11/27/2019 0216   BUN 11 07/31/2020 1709   CREATININE 0.60 07/31/2020 1709   CALCIUM 9.3 07/31/2020 1709   GFRNONAA 104 07/31/2020 1709   GFRAA 120 07/31/2020 1709    BNP No results found for: BNP  ProBNP No results found for: PROBNP  Imaging: No results found.   Assessment & Plan:   Dyspnea on exertion - Persistent DOE since PE in March  2021, she is maintained on lifelong Eliquis  - No evidence of COPD or asthma of pulmonary function testing. Minimal restriction with decreased ERV. No BD response.  - Checking CXR today and FENO - Encourage weight loss efforts, aim 15-30 lbs - She is unable to attend cardiopulmonary rehabd d/t cost, advised she get 20-30 mins regular cardiovascular exercise  Chronic cough - Chronic dry cough, GERD or PND symptoms could likely be contributing  - Checking CXR and CBC  with diff - Advised she increase PPI to BID and start Flonase nasal spray    Glenford Bayley, NP 03/08/2021

## 2021-03-08 NOTE — Progress Notes (Signed)
Please let patient know CXR showed no active cardiopulmonary disease, lungs clear and heart size normal limits.

## 2021-03-11 ENCOUNTER — Encounter: Payer: Self-pay | Admitting: *Deleted

## 2021-04-02 ENCOUNTER — Encounter: Payer: Self-pay | Admitting: Cardiology

## 2021-04-02 ENCOUNTER — Ambulatory Visit (INDEPENDENT_AMBULATORY_CARE_PROVIDER_SITE_OTHER): Payer: Commercial Managed Care - PPO | Admitting: Cardiology

## 2021-04-02 ENCOUNTER — Other Ambulatory Visit: Payer: Self-pay

## 2021-04-02 VITALS — BP 134/78 | HR 74 | Ht 68.0 in | Wt 240.8 lb

## 2021-04-02 DIAGNOSIS — R0609 Other forms of dyspnea: Secondary | ICD-10-CM

## 2021-04-02 DIAGNOSIS — E7800 Pure hypercholesterolemia, unspecified: Secondary | ICD-10-CM

## 2021-04-02 DIAGNOSIS — R0600 Dyspnea, unspecified: Secondary | ICD-10-CM | POA: Diagnosis not present

## 2021-04-02 DIAGNOSIS — Z86711 Personal history of pulmonary embolism: Secondary | ICD-10-CM | POA: Diagnosis not present

## 2021-04-02 DIAGNOSIS — R600 Localized edema: Secondary | ICD-10-CM | POA: Diagnosis not present

## 2021-04-02 NOTE — Progress Notes (Signed)
Cardiology Office Note:    Date:  04/02/2021   ID:  Meagan Doyle, DOB 11-10-1965, MRN [id number]  PCP:  Audie Pinto, FNP  Cardiologist:  Gypsy Balsam, MD    Referring MD: Audie Pinto, FNP   Chief Complaint  Patient presents with   Follow-up  Still the same symptoms some fatigue tiredness shortness of breath or leg pain  History of Present Illness:    Meagan Doyle is a 55 y.o. female with past medical history significant pulmonary emboli and DVT.  Initial insult was after she got cholecystectomy done then later she got the surgery and again end up having pulmonary emboli.  Also at that time pulmonary infarct.  At that time she did have right ventricular strain pattern with pulmonary pressure 44 mmHg she was transferred to [facility] for more advanced therapy however that she did not require this and she did quite well she is following with me.  She is complaining being tired exhausted getting short of breath with exertion also some swelling of lower extremities as well as pain in her legs.  We did check arterial circulation which show no critical obstruction that can be fixed.  She did see pulmonary I did review note by pulmonary and they do not think there is significant lung problem.  Probably portion of the issue is deconditioning.  Past Medical History:  Diagnosis Date   Abdominal pain 10/11/2015   Aortic atherosclerosis (HCC) 09/27/2016   Formatting of this note might be different from the original. Seen on chest x-ray in 08/2016.   Bilateral lower extremity edema 06/14/2020   Chronic cough 03/08/2021   Chronic pain 11/25/2019   DDD (degenerative disc disease), lumbar 10/24/2019   Dyspnea on exertion 12/29/2019   Gallstone 10/11/2015   GERD (gastroesophageal reflux disease)    Hiatal hernia 08/12/2016   High blood cholesterol    High risk medication use 08/13/2016   History of pulmonary embolism 12/29/2019   Hyperlipemia 12/24/2016   Hypertensive crisis 05/01/2020    Iron deficiency anemia 08/12/2016   Lumbar facet arthropathy 10/24/2019   Malaise and fatigue 08/13/2016   Morbid obesity (HCC) 11/25/2019   Obesity (BMI 30-39.9) 08/13/2016   Prediabetes 08/12/2016   Primary osteoarthritis of right knee 09/16/2019   Pulmonary emboli (HCC) 11/25/2019   Pulmonary embolism (HCC)    Pulmonary embolism and infarction (HCC) 11/25/2019   S/P arthroscopic surgery of right knee 03/13/2020   Tear of medial meniscus of right knee 08/26/2019   Vitamin B12 deficiency 08/21/2016    Past Surgical History:  Procedure Laterality Date   ABDOMINAL HYSTERECTOMY     CHOLECYSTECTOMY     KNEE ARTHROPLASTY      Current Medications: Current Meds  Medication Sig   albuterol (ACCUNEB) 0.63 MG/3ML nebulizer solution Take 3 mLs by nebulization as directed.   amLODipine (NORVASC) 5 MG tablet Take 0.5 mg by mouth daily.   atorvastatin (LIPITOR) 10 MG tablet Take 2 tablets (20 mg total) by mouth daily.   furosemide (LASIX) 20 MG tablet Take 20 mg by mouth as needed for edema.   hydroxychloroquine (PLAQUENIL) 200 MG tablet Take 400 mg by mouth daily.   losartan (COZAAR) 50 MG tablet Take 2 tablets by mouth daily. For 30 days   omeprazole (PRILOSEC OTC) 20 MG tablet Take 20 mg by mouth daily.   XARELTO 20 MG TABS tablet Take 20 mg by mouth daily.     Allergies:   Patient has no known allergies.  Social History   Socioeconomic History   Marital status: Married    Spouse name: Not on file   Number of children: Not on file   Years of education: Not on file   Highest education level: Not on file  Occupational History   Not on file  Tobacco Use   Smoking status: Former    Packs/day: 1.00    Years: 15.00    Pack years: 15.00    Types: Cigarettes    Quit date: 2003    Years since quitting: 19.5   Smokeless tobacco: Never  Vaping Use   Vaping Use: Never used  Substance and Sexual Activity   Alcohol use: Not Currently   Drug use: Never   Sexual activity: Not on file   Other Topics Concern   Not on file  Social History Narrative   Not on file   Social Determinants of Health   Financial Resource Strain: Not on file  Food Insecurity: Not on file  Transportation Needs: Not on file  Physical Activity: Not on file  Stress: Not on file  Social Connections: Not on file     Family History: The patient's family history is not on file. ROS:   Please see the history of present illness.    All 14 point review of systems negative except as described per history of present illness  EKGs/Labs/Other Studies Reviewed:      Recent Labs: 07/31/2020: BUN 11; Creatinine, Ser 0.60; Potassium 3.7; Sodium 144 03/08/2021: Hemoglobin 12.5; Platelets 263.0; Pro B Natriuretic peptide (BNP) 65.0  Recent Lipid Panel    Component Value Date/Time   CHOL 182 01/14/2021 1559   TRIG 107 01/14/2021 1559   HDL 62 01/14/2021 1559   CHOLHDL 2.9 01/14/2021 1559   CHOLHDL 3.6 11/27/2019 0216   VLDL 18 11/27/2019 0216   LDLCALC 101 (H) 01/14/2021 1559    Physical Exam:    VS:  BP 134/78 (BP Location: Right Arm, Patient Position: Sitting)   Pulse 74   Ht 5\' 8"  (1.727 m)   Wt 240 lb 12.8 oz (109.2 kg)   SpO2 95%   BMI 36.61 kg/m     Wt Readings from Last 3 Encounters:  04/02/21 240 lb 12.8 oz (109.2 kg)  03/08/21 237 lb (107.5 kg)  01/14/21 236 lb (107 kg)     GEN:  Well nourished, well developed in no acute distress HEENT: Normal NECK: No JVD; No carotid bruits LYMPHATICS: No lymphadenopathy CARDIAC: RRR, no murmurs, no rubs, no gallops RESPIRATORY:  Clear to auscultation without rales, wheezing or rhonchi  ABDOMEN: Soft, non-tender, non-distended MUSCULOSKELETAL:  No edema; No deformity  SKIN: Warm and dry LOWER EXTREMITIES: no swelling NEUROLOGIC:  Alert and oriented x 3 PSYCHIATRIC:  Normal affect   ASSESSMENT:    1. History of pulmonary embolism   2. Dyspnea on exertion   3. High blood cholesterol   4. Bilateral lower extremity edema    PLAN:     In order of problems listed above:  History of pulmonary emboli.  She is anticoagulated we will continue present management. Dyspnea exertion multifactorial quite extensive evaluation has been performed by pulmonary and cardiology.  So far no clear-cut reason identified.  I recommended to be able be more active and build up stamina. Dyslipidemia I did review her K PN which show LDL of 101 and HDL 62.  This an acceptable cholesterol profile for her clinical scenario. Bilateral lower extremities edema only mild does not bother her much we will  continue present management.   Medication Adjustments/Labs and Tests Ordered: Current medicines are reviewed at length with the patient today.  Concerns regarding medicines are outlined above.  No orders of the defined types were placed in this encounter.  Medication changes: No orders of the defined types were placed in this encounter.   Signed, Georgeanna Lea, MD, [facility] 04/02/2021 4:32 PM    [facility]

## 2021-04-02 NOTE — Patient Instructions (Signed)
Medication Instructions:  Your physician recommends that you continue on your current medications as directed. Please refer to the Current Medication list given to you today.  *If you need a refill on your cardiac medications before your next appointment, please call your pharmacy*   Lab Work:  If you have labs (blood work) drawn today and your tests are completely normal, you will receive your results only by: MyChart Message (if you have MyChart) OR A paper copy in the mail If you have any lab test that is abnormal or we need to change your treatment, we will call you to review the results.   Testing/Procedures:    Follow-Up: At [facility], you and your health needs are our priority.  As part of our continuing mission to provide you with exceptional heart care, we have created designated Provider Care Teams.  These Care Teams include your primary Cardiologist (physician) and Advanced Practice Providers (APPs -  Physician Assistants and Nurse Practitioners) who all work together to provide you with the care you need, when you need it.  We recommend signing up for the patient portal called "MyChart".  Sign up information is provided on this After Visit Summary.  MyChart is used to connect with patients for Virtual Visits (Telemedicine).  Patients are able to view lab/test results, encounter notes, upcoming appointments, etc.  Non-urgent messages can be sent to your provider as well.   To learn more about what you can do with MyChart, go to [email or web address].    Your next appointment:   5 month(s)  The format for your next appointment:   In Person  Provider:   Robert Krasowski, MD   Other Instructions  ' 

## 2021-04-23 ENCOUNTER — Ambulatory Visit: Payer: Commercial Managed Care - PPO | Admitting: Pulmonary Disease

## 2021-05-13 DIAGNOSIS — Z86718 Personal history of other venous thrombosis and embolism: Secondary | ICD-10-CM

## 2021-05-13 HISTORY — DX: Personal history of other venous thrombosis and embolism: Z86.718

## 2021-06-04 ENCOUNTER — Telehealth: Payer: Self-pay | Admitting: Cardiology

## 2021-06-04 ENCOUNTER — Inpatient Hospital Stay (HOSPITAL_COMMUNITY)
Admission: EM | Admit: 2021-06-04 | Discharge: 2021-06-06 | DRG: 176 | Disposition: A | Discharge status: Home / Self Care | Payer: Commercial Managed Care - PPO | Attending: Family Medicine | Admitting: Family Medicine

## 2021-06-04 ENCOUNTER — Emergency Department (HOSPITAL_COMMUNITY): Payer: Commercial Managed Care - PPO

## 2021-06-04 ENCOUNTER — Encounter (HOSPITAL_COMMUNITY): Payer: Self-pay

## 2021-06-04 ENCOUNTER — Other Ambulatory Visit: Payer: Self-pay

## 2021-06-04 DIAGNOSIS — I2694 Multiple subsegmental pulmonary emboli without acute cor pulmonale: Secondary | ICD-10-CM | POA: Diagnosis present

## 2021-06-04 DIAGNOSIS — R7303 Prediabetes: Secondary | ICD-10-CM | POA: Diagnosis present

## 2021-06-04 DIAGNOSIS — Z7901 Long term (current) use of anticoagulants: Secondary | ICD-10-CM

## 2021-06-04 DIAGNOSIS — I2699 Other pulmonary embolism without acute cor pulmonale: Secondary | ICD-10-CM

## 2021-06-04 DIAGNOSIS — I700 Atherosclerosis of aorta: Secondary | ICD-10-CM | POA: Diagnosis present

## 2021-06-04 DIAGNOSIS — E782 Mixed hyperlipidemia: Secondary | ICD-10-CM | POA: Diagnosis not present

## 2021-06-04 DIAGNOSIS — Z9049 Acquired absence of other specified parts of digestive tract: Secondary | ICD-10-CM | POA: Diagnosis not present

## 2021-06-04 DIAGNOSIS — I10 Essential (primary) hypertension: Secondary | ICD-10-CM | POA: Diagnosis present

## 2021-06-04 DIAGNOSIS — E785 Hyperlipidemia, unspecified: Secondary | ICD-10-CM | POA: Diagnosis present

## 2021-06-04 DIAGNOSIS — I82431 Acute embolism and thrombosis of right popliteal vein: Secondary | ICD-10-CM | POA: Diagnosis present

## 2021-06-04 DIAGNOSIS — Z79899 Other long term (current) drug therapy: Secondary | ICD-10-CM | POA: Diagnosis not present

## 2021-06-04 DIAGNOSIS — Z86718 Personal history of other venous thrombosis and embolism: Secondary | ICD-10-CM

## 2021-06-04 DIAGNOSIS — E7800 Pure hypercholesterolemia, unspecified: Secondary | ICD-10-CM | POA: Diagnosis present

## 2021-06-04 DIAGNOSIS — Z90710 Acquired absence of both cervix and uterus: Secondary | ICD-10-CM | POA: Diagnosis not present

## 2021-06-04 DIAGNOSIS — I82409 Acute embolism and thrombosis of unspecified deep veins of unspecified lower extremity: Secondary | ICD-10-CM | POA: Diagnosis present

## 2021-06-04 DIAGNOSIS — Z86711 Personal history of pulmonary embolism: Secondary | ICD-10-CM

## 2021-06-04 DIAGNOSIS — I82441 Acute embolism and thrombosis of right tibial vein: Secondary | ICD-10-CM | POA: Diagnosis present

## 2021-06-04 DIAGNOSIS — Z87891 Personal history of nicotine dependence: Secondary | ICD-10-CM

## 2021-06-04 DIAGNOSIS — M351 Other overlap syndromes: Secondary | ICD-10-CM | POA: Diagnosis present

## 2021-06-04 DIAGNOSIS — K219 Gastro-esophageal reflux disease without esophagitis: Secondary | ICD-10-CM | POA: Diagnosis present

## 2021-06-04 DIAGNOSIS — I82411 Acute embolism and thrombosis of right femoral vein: Secondary | ICD-10-CM | POA: Diagnosis present

## 2021-06-04 DIAGNOSIS — I2609 Other pulmonary embolism with acute cor pulmonale: Secondary | ICD-10-CM | POA: Diagnosis not present

## 2021-06-04 DIAGNOSIS — Z20822 Contact with and (suspected) exposure to covid-19: Secondary | ICD-10-CM | POA: Diagnosis present

## 2021-06-04 HISTORY — DX: Other pulmonary embolism without acute cor pulmonale: I26.99

## 2021-06-04 LAB — CBC WITH DIFFERENTIAL/PLATELET
Abs Immature Granulocytes: 0.02 10*3/uL (ref 0.00–0.07)
Basophils Absolute: 0 10*3/uL (ref 0.0–0.1)
Basophils Relative: 1 %
Eosinophils Absolute: 0.2 10*3/uL (ref 0.0–0.5)
Eosinophils Relative: 3 %
HCT: 41.6 % (ref 36.0–46.0)
Hemoglobin: 13.4 g/dL (ref 12.0–15.0)
Immature Granulocytes: 0 %
Lymphocytes Relative: 31 %
Lymphs Abs: 1.9 10*3/uL (ref 0.7–4.0)
MCH: 28.3 pg (ref 26.0–34.0)
MCHC: 32.2 g/dL (ref 30.0–36.0)
MCV: 87.9 fL (ref 80.0–100.0)
Monocytes Absolute: 0.3 10*3/uL (ref 0.1–1.0)
Monocytes Relative: 4 %
Neutro Abs: 3.7 10*3/uL (ref 1.7–7.7)
Neutrophils Relative %: 61 %
Platelets: 262 10*3/uL (ref 150–400)
RBC: 4.73 MIL/uL (ref 3.87–5.11)
RDW: 14.6 % (ref 11.5–15.5)
WBC: 6 10*3/uL (ref 4.0–10.5)
nRBC: 0 % (ref 0.0–0.2)

## 2021-06-04 LAB — BASIC METABOLIC PANEL
Anion gap: 12 (ref 5–15)
BUN: 11 mg/dL (ref 6–20)
CO2: 23 mmol/L (ref 22–32)
Calcium: 9.4 mg/dL (ref 8.9–10.3)
Chloride: 104 mmol/L (ref 98–111)
Creatinine, Ser: 0.75 mg/dL (ref 0.44–1.00)
GFR, Estimated: >60 mL/min (ref >60)
Glucose, Bld: 89 mg/dL (ref 70–99)
Potassium: 3.6 mmol/L (ref 3.5–5.1)
Sodium: 139 mmol/L (ref 135–145)

## 2021-06-04 LAB — RESP PANEL BY RT-PCR (FLU A&B, COVID) ARPGX2
Influenza A by PCR: NEGATIVE
Influenza B by PCR: NEGATIVE
SARS Coronavirus 2 by RT PCR: NEGATIVE

## 2021-06-04 LAB — TROPONIN I (HIGH SENSITIVITY)
Troponin I (High Sensitivity): 7 ng/L (ref <18)
Troponin I (High Sensitivity): 6 ng/L (ref <18)

## 2021-06-04 LAB — HEPARIN LEVEL (UNFRACTIONATED): Heparin Unfractionated: <0.1 IU/mL — ABNORMAL LOW (ref 0.30–0.70)

## 2021-06-04 LAB — BRAIN NATRIURETIC PEPTIDE: B Natriuretic Peptide: 79 pg/mL (ref 0.0–100.0)

## 2021-06-04 MED ORDER — IOHEXOL 350 MG/ML SOLN
75.0000 mL | Freq: Once | INTRAVENOUS | Status: AC | PRN
  Administered 2021-06-04: 75 mL via INTRAVENOUS

## 2021-06-04 MED ORDER — ALBUTEROL SULFATE (2.5 MG/3ML) 0.083% IN NEBU: 2.5000 mg | INHALATION_SOLUTION | RESPIRATORY_TRACT | Status: DC | PRN

## 2021-06-04 MED ORDER — AMLODIPINE BESYLATE 5 MG PO TABS
2.5000 mg | ORAL_TABLET | Freq: Every day | ORAL | Status: DC
  Administered 2021-06-05 – 2021-06-06 (×2): 2.5 mg via ORAL
  Filled 2021-06-04 (×2): qty 1

## 2021-06-04 MED ORDER — IBUPROFEN 200 MG PO TABS
400.0000 mg | ORAL_TABLET | Freq: Four times a day (QID) | ORAL | Status: DC | PRN
  Administered 2021-06-05: 400 mg via ORAL
  Filled 2021-06-04: qty 1

## 2021-06-04 MED ORDER — ATORVASTATIN CALCIUM 10 MG PO TABS
10.0000 mg | ORAL_TABLET | Freq: Every day | ORAL | Status: DC
  Administered 2021-06-05 – 2021-06-06 (×2): 10 mg via ORAL
  Filled 2021-06-04 (×2): qty 1

## 2021-06-04 MED ORDER — ACETAMINOPHEN 325 MG PO TABS
650.0000 mg | ORAL_TABLET | Freq: Four times a day (QID) | ORAL | Status: DC | PRN
  Administered 2021-06-05: 650 mg via ORAL
  Filled 2021-06-04: qty 2

## 2021-06-04 MED ORDER — HYDROXYCHLOROQUINE SULFATE 200 MG PO TABS
400.0000 mg | ORAL_TABLET | Freq: Every day | ORAL | Status: DC
  Administered 2021-06-05 – 2021-06-06 (×2): 400 mg via ORAL
  Filled 2021-06-04 (×2): qty 2

## 2021-06-04 MED ORDER — ACETAMINOPHEN 650 MG RE SUPP: 650.0000 mg | Freq: Four times a day (QID) | RECTAL | Status: DC | PRN

## 2021-06-04 MED ORDER — HEPARIN (PORCINE) 25000 UT/250ML-% IV SOLN
1500.0000 [IU]/h | INTRAVENOUS | Status: DC
  Administered 2021-06-04: 1600 [IU]/h via INTRAVENOUS
  Administered 2021-06-06 (×2): 1500 [IU]/h via INTRAVENOUS
  Filled 2021-06-04 (×3): qty 250

## 2021-06-04 MED ORDER — ONDANSETRON HCL 4 MG/2ML IJ SOLN: 4.0000 mg | Freq: Four times a day (QID) | INTRAMUSCULAR | Status: DC | PRN

## 2021-06-04 MED ORDER — ONDANSETRON HCL 4 MG PO TABS: 4.0000 mg | ORAL_TABLET | Freq: Four times a day (QID) | ORAL | Status: DC | PRN

## 2021-06-04 MED ORDER — LOSARTAN POTASSIUM 50 MG PO TABS
50.0000 mg | ORAL_TABLET | Freq: Every day | ORAL | Status: DC
  Administered 2021-06-05 – 2021-06-06 (×2): 50 mg via ORAL
  Filled 2021-06-04 (×3): qty 1

## 2021-06-04 MED ORDER — OMEPRAZOLE MAGNESIUM 20 MG PO TBEC: 20.0000 mg | DELAYED_RELEASE_TABLET | Freq: Every day | ORAL | Status: DC

## 2021-06-04 NOTE — Progress Notes (Addendum)
ANTICOAGULATION CONSULT NOTE - Initial Consult  Pharmacy Consult for Heparin Indication: pulmonary embolus  No Known Allergies  Patient Measurements: Height: 5\' 8"  (172.7 cm) Weight: 104.3 kg (230 lb) IBW/kg (Calculated) : 63.9 Heparin Dosing Weight: 87.2 kg  Vital Signs: Temp: 97.7 F (36.5 C) (09/06 2146) Temp Source: Oral (09/06 2146) BP: 143/83 (09/06 2200) Pulse Rate: 79 (09/06 2200)  Labs: Recent Labs    06/04/21 1833 06/04/21 2026  HGB 13.4  --   HCT 41.6  --   PLT 262  --   CREATININE 0.75  --   TROPONINIHS 7 6    Estimated Creatinine Clearance: 101.7 mL/min (by C-G formula based on SCr of 0.75 mg/dL).   Medical History: Past Medical History:  Diagnosis Date   Abdominal pain 10/11/2015   Aortic atherosclerosis (HCC) 09/27/2016   Formatting of this note might be different from the original. Seen on chest x-ray in 08/2016.   Bilateral lower extremity edema 06/14/2020   Chronic cough 03/08/2021   Chronic pain 11/25/2019   DDD (degenerative disc disease), lumbar 10/24/2019   Dyspnea on exertion 12/29/2019   Gallstone 10/11/2015   GERD (gastroesophageal reflux disease)    Hiatal hernia 08/12/2016   High blood cholesterol    High risk medication use 08/13/2016   History of pulmonary embolism 12/29/2019   Hyperlipemia 12/24/2016   Hypertensive crisis 05/01/2020   Iron deficiency anemia 08/12/2016   Lumbar facet arthropathy 10/24/2019   Malaise and fatigue 08/13/2016   Morbid obesity (HCC) 11/25/2019   Obesity (BMI 30-39.9) 08/13/2016   Prediabetes 08/12/2016   Primary osteoarthritis of right knee 09/16/2019   Pulmonary emboli (HCC) 11/25/2019   Pulmonary embolism (HCC)    Pulmonary embolism and infarction (HCC) 11/25/2019   S/P arthroscopic surgery of right knee 03/13/2020   Tear of medial meniscus of right knee 08/26/2019   Vitamin B12 deficiency 08/21/2016    Medications:  (Not in a hospital admission)  Scheduled:  Infusions:  PRN:   Assessment: 4 yof  with a history of PE on Xarelto. Patient is presenting with SOB. Patient with outpatient 57 in [place] earlier today positive for DVT. Heparin per pharmacy consult placed for  PE .  CT angio chest w/ Numerous bilateral pulmonary emboli, most pronounced in the lower lobes, left greater than right  Patient is supposed to be taking xarelto; however, patient states that she frequently takes every other day (sometimes less frequently) due to cost. Last taken 9/5 per patient.  Hgb13.4;plt 262  Goal of Therapy:  Heparin level 0.3-0.7 units/ml aPTT 66-102 Monitor platelets by anticoagulation protocol: Yes   Plan:  No initial bolus given transition from DOAC Start heparin infusion at 1600 units/hr Check aPTT/anti-Xa level in 6 hours and daily while on heparin Continue to monitor H&H and platelets  [place], PharmD, BCPS 06/04/2021 10:29 PM ED Clinical Pharmacist -  [phone]

## 2021-06-04 NOTE — ED Provider Notes (Addendum)
Emergency Department Provider Note   I have reviewed the triage vital signs and the nursing notes.   HISTORY  Chief Complaint Shortness of Breath   HPI Meagan Doyle is a 55 y.o. female with past medical history reviewed below including recurrent DVT, intermittently compliant with Xarelto, presents to the emergency department with leg pain and shortness of breath.  She had an outpatient ultrasound today in [place] which was positive for acute DVT on the right.  She states that she is short of breath most of the time but over the past week she has been especially short of breath.  She states symptoms are mainly with exertion.  Does not have any chest pain/pressure/tightness.  No fevers or chills.  No pleuritic pain symptoms.  Does note that Xarelto is very expensive and that causes her to take it every other day, sometimes less frequently. After her positive DVT study she was sent for ED evaluation and PE r/o.   Past Medical History:  Diagnosis Date   Abdominal pain 10/11/2015   Aortic atherosclerosis (HCC) 09/27/2016   Formatting of this note might be different from the original. Seen on chest x-ray in 08/2016.   Bilateral lower extremity edema 06/14/2020   Chronic cough 03/08/2021   Chronic pain 11/25/2019   DDD (degenerative disc disease), lumbar 10/24/2019   Dyspnea on exertion 12/29/2019   Gallstone 10/11/2015   GERD (gastroesophageal reflux disease)    Hiatal hernia 08/12/2016   High blood cholesterol    High risk medication use 08/13/2016   History of pulmonary embolism 12/29/2019   Hyperlipemia 12/24/2016   Hypertensive crisis 05/01/2020   Iron deficiency anemia 08/12/2016   Lumbar facet arthropathy 10/24/2019   Malaise and fatigue 08/13/2016   Morbid obesity (HCC) 11/25/2019   Obesity (BMI 30-39.9) 08/13/2016   Prediabetes 08/12/2016   Primary osteoarthritis of right knee 09/16/2019   Pulmonary emboli (HCC) 11/25/2019   Pulmonary embolism (HCC)    Pulmonary embolism and  infarction (HCC) 11/25/2019   S/P arthroscopic surgery of right knee 03/13/2020   Tear of medial meniscus of right knee 08/26/2019   Vitamin B12 deficiency 08/21/2016    Patient Active Problem List   Diagnosis Date Noted   Acute pulmonary embolism (HCC) 06/04/2021   Chronic cough 03/08/2021   Pulmonary embolism (HCC)    High blood cholesterol    Bilateral lower extremity edema 06/14/2020   Hypertensive crisis 05/01/2020   S/P arthroscopic surgery of right knee 03/13/2020   History of pulmonary embolism 12/29/2019   Dyspnea on exertion 12/29/2019   Chronic pain 11/25/2019   Morbid obesity (HCC) 11/25/2019   Pulmonary embolism and infarction (HCC) 11/25/2019   Lumbar facet arthropathy 10/24/2019   DDD (degenerative disc disease), lumbar 10/24/2019   Primary osteoarthritis of right knee 09/16/2019   Tear of medial meniscus of right knee 08/26/2019   Hyperlipemia 12/24/2016   Aortic atherosclerosis (HCC) 09/27/2016   Vitamin B12 deficiency 08/21/2016   High risk medication use 08/13/2016   Malaise and fatigue 08/13/2016   Obesity (BMI 30-39.9) 08/13/2016   GERD (gastroesophageal reflux disease) 08/12/2016   Hiatal hernia 08/12/2016   Iron deficiency anemia 08/12/2016   Prediabetes 08/12/2016   Abdominal pain 10/11/2015   Gallstone 10/11/2015    Past Surgical History:  Procedure Laterality Date   ABDOMINAL HYSTERECTOMY     CHOLECYSTECTOMY     KNEE ARTHROPLASTY      Allergies Patient has no known allergies.  No family history on file.  Social History Social  History   Tobacco Use   Smoking status: Former    Packs/day: 1.00    Years: 15.00    Pack years: 15.00    Types: Cigarettes    Quit date: 2003    Years since quitting: 19.6   Smokeless tobacco: Never  Vaping Use   Vaping Use: Never used  Substance Use Topics   Alcohol use: Not Currently   Drug use: Never    Review of Systems  Constitutional: No fever/chills Eyes: No visual changes. ENT: No sore  throat. Cardiovascular: Denies chest pain. Respiratory: Positive shortness of breath. Gastrointestinal: No abdominal pain.  No nausea, no vomiting.  No diarrhea.  No constipation. Genitourinary: Negative for dysuria. Musculoskeletal: Negative for back pain. Positive right leg pain/swelling.  Skin: Negative for rash. Neurological: Negative for headaches, focal weakness or numbness.  10-point ROS otherwise negative.  ____________________________________________   PHYSICAL EXAM:  VITAL SIGNS: ED Triage Vitals  Enc Vitals Group     BP 06/04/21 1748 (!) 143/120     Pulse Rate 06/04/21 1748 88     Resp 06/04/21 1748 18     Temp 06/04/21 1748 98.9 F (37.2 C)     Temp Source 06/04/21 1748 Oral     SpO2 06/04/21 1748 99 %   Constitutional: Alert and oriented. Well appearing and in no acute distress. Eyes: Conjunctivae are normal.  Head: Atraumatic. Nose: No congestion/rhinnorhea. Mouth/Throat: Mucous membranes are moist.  Oropharynx non-erythematous. Neck: No stridor.   Cardiovascular: Normal rate, regular rhythm. Good peripheral circulation. Grossly normal heart sounds.   Respiratory: Increased respiratory effort with minimal exertion.  No retractions. Lungs CTAB. Gastrointestinal: Soft and nontender. No distention.  Musculoskeletal: No lower extremity tenderness nor edema. No gross deformities of extremities. Neurologic:  Normal speech and language. No gross focal neurologic deficits are appreciated.  Skin:  Skin is warm, dry and intact. No rash noted.  ____________________________________________   LABS (all labs ordered are listed, but only abnormal results are displayed)  Labs Reviewed  RESP PANEL BY RT-PCR (FLU A&B, COVID) [id number]  BASIC METABOLIC PANEL  CBC WITH DIFFERENTIAL/PLATELET  BRAIN NATRIURETIC PEPTIDE  APTT  APTT  HEPARIN LEVEL (UNFRACTIONATED)  HEPARIN LEVEL (UNFRACTIONATED)  PROTIME-INR  HIV ANTIBODY (ROUTINE TESTING W REFLEX)  CBC  BASIC METABOLIC  PANEL  TROPONIN I (HIGH SENSITIVITY)  TROPONIN I (HIGH SENSITIVITY)   ____________________________________________  EKG   EKG Interpretation  Date/Time:  Tuesday June 04 2021 17:49:38 EDT Ventricular Rate:  83 PR Interval:  140 QRS Duration: 92 QT Interval:  384 QTC Calculation: 451 R Axis:   52 Text Interpretation: Normal sinus rhythm with sinus arrhythmia Normal ECG Confirmed by Alona BeneLong, Kasandra Fehr [phone]([id number]) on 06/04/2021 7:00:02 PM        ____________________________________________  RADIOLOGY  DG Chest 2 View  Result Date: 06/04/2021 CLINICAL DATA:  Shortness of breath EXAM: CHEST - 2 VIEW COMPARISON:  03/08/2021 FINDINGS: The heart size and mediastinal contours are within normal limits. Aortic atherosclerotic calcifications. Both lungs are clear. No acute osseous abnormality. IMPRESSION: No active cardiopulmonary disease. Electronically Signed   By: Wiliam KeAlison  Vasan M.D.   On: 06/04/2021 19:25   CT Angio Chest PE W and/or Wo Contrast  Result Date: 06/04/2021 CLINICAL DATA:  PE suspected, high prob EXAM: CT ANGIOGRAPHY CHEST WITH CONTRAST TECHNIQUE: Multidetector CT imaging of the chest was performed using the standard protocol during bolus administration of intravenous contrast. Multiplanar CT image reconstructions and MIPs were obtained to evaluate the vascular anatomy. CONTRAST:  75mL OMNIPAQUE IOHEXOL  350 MG/ML SOLN COMPARISON:  05/01/2020 FINDINGS: Cardiovascular: Bilateral pulmonary emboli are noted involving all lobes of both lungs, most pronounced in the lower lobes, left greater than right. No evidence of right heart strain (RV to LV ratio 0.83). Heart is normal size. Aortic and coronary artery calcifications. Mediastinum/Nodes: No mediastinal, hilar, or axillary adenopathy. Trachea and esophagus are unremarkable. Thyroid unremarkable. Lungs/Pleura: No confluent airspace opacities. Early ground-glass opacities peripherally in both lower lobes could reflect atelectasis or early  infarcts. No effusions. Upper Abdomen: Imaging into the upper abdomen demonstrates no acute findings. Musculoskeletal: Chest wall soft tissues are unremarkable. No acute bony abnormality. Review of the MIP images confirms the above findings. IMPRESSION: Numerous bilateral pulmonary emboli, most pronounced in the lower lobes, left greater than right. No evidence of right heart strain. Ground-glass peripheral opacities in the lower lobes could reflect atelectasis or early infarcts. Scattered coronary artery calcifications. Aortic Atherosclerosis (ICD10-I70.0). These results were called by telephone at the time of interpretation on 06/04/2021 at 10:16 pm to provider [facility] , who verbally acknowledged these results. Electronically Signed   By: Charlett Nose M.D.   On: 06/04/2021 22:19    ____________________________________________   PROCEDURES  Procedure(s) performed:   Procedures  CRITICAL CARE Performed by: Maia Plan Total critical care time: 35 minutes Critical care time was exclusive of separately billable procedures and treating other patients. Critical care was necessary to treat or prevent imminent or life-threatening deterioration. Critical care was time spent personally by me on the following activities: development of treatment plan with patient and/or surrogate as well as nursing, discussions with consultants, evaluation of patient's response to treatment, examination of patient, obtaining history from patient or surrogate, ordering and performing treatments and interventions, ordering and review of laboratory studies, ordering and review of radiographic studies, pulse oximetry and re-evaluation of patient's condition.  Alona Bene, MD Emergency Medicine  ____________________________________________   INITIAL IMPRESSION / ASSESSMENT AND PLAN / ED COURSE  Pertinent labs & imaging results that were available during my care of the patient were reviewed by me and considered  in my medical decision making (see chart for details).   Moved to the emergency department for evaluation of pain in the right leg with DVT diagnosed earlier today.  Patient taking Xarelto every other day due to cost issues.  Feeling short of breath and patient does become dyspneic even with sitting up in bed.  She is not hypoxemic or tachycardic.   Differential includes all life-threatening causes for chest pain. This includes but is not exclusive to acute coronary syndrome, aortic dissection, pulmonary embolism, cardiac tamponade, community-acquired pneumonia, pericarditis, musculoskeletal chest wall pain, etc.  CTA performed looking for PE which shows bilateral PE without radiographic signs of right heart strain.  Patient started on heparin.  Will admit to the hospitalist service to consider alternate anticoagulation versus IVC filter although given how she is taking her Xarelto would not consider a true anticoagulation failure.   Discussed patient's case with [facility] to request admission. Patient and family (if present) updated with plan. Care transferred to [facility] service.  I reviewed all nursing notes, vitals, pertinent old records, EKGs, labs, imaging (as available).  ____________________________________________  FINAL CLINICAL IMPRESSION(S) / ED DIAGNOSES  Final diagnoses:  Multiple subsegmental pulmonary emboli without acute cor pulmonale (HCC)     MEDICATIONS GIVEN DURING THIS VISIT:  Medications  heparin ADULT infusion 100 units/mL (25000 units/25mL) (1,600 Units/hr Intravenous New Bag/Given 06/04/21 2317)  acetaminophen (TYLENOL) tablet 650 mg (has no administration  in time range)    Or  acetaminophen (TYLENOL) suppository 650 mg (has no administration in time range)  ondansetron (ZOFRAN) tablet 4 mg (has no administration in time range)    Or  ondansetron (ZOFRAN) injection 4 mg (has no administration in time range)  iohexol (OMNIPAQUE) 350 MG/ML injection 75 mL (75 mLs  Intravenous Contrast Given 06/04/21 2212)     Note:  This document was prepared using Dragon voice recognition software and may include unintentional dictation errors.  Alona Bene, MD, FACEP Emergency Medicine    Anatole Apollo, Arlyss Repress, MD 06/04/21 2328    Maia Plan, MD 06/04/21 2328

## 2021-06-04 NOTE — ED Notes (Addendum)
Dr Long at bedside

## 2021-06-04 NOTE — H&P (Signed)
History and Physical    Meagan Doyle WGN:[id number] DOB: 03/11/1966 DOA: 06/04/2021  PCP: Audie Pinto, FNP  Patient coming from: Home  I have personally briefly reviewed patient's old medical records in [facility]  Chief Complaint: PE/DVT  HPI: Meagan Doyle is a 55 y.o. 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  Pt prescribed Xarelto but admits to only taking Xarelto every other day due to extreme cost.  Apparently costing patient between $200-$300 out of pocket per month prescription even with [facility] insurance!  Unfortunately, pt with f/u DVT US done at OSH earlier today that was positive for extensive DVT.  Pt already in to ED this evening with c/o increased SOB over the past 1 week, symptoms worse with exertion.  No fevers, chills, CP.   ED Course: CTA chest positive for multiple bilateral pulmonary emboli, no RHS.  Started on heparin gtt.  They are faxing the positive LE [place] studies for DVT.   Review of Systems: As per HPI, otherwise all review of systems negative.  Past Medical History:  Diagnosis Date   Abdominal pain 10/11/2015   Aortic atherosclerosis (HCC) 09/27/2016   Formatting of this note might be different from the original. Seen on chest x-ray in 08/2016.   Bilateral lower extremity edema 06/14/2020   Chronic cough 03/08/2021   Chronic pain 11/25/2019   DDD (degenerative disc disease), lumbar 10/24/2019   Dyspnea on exertion 12/29/2019   Gallstone 10/11/2015   GERD (gastroesophageal reflux disease)    Hiatal hernia 08/12/2016   High blood cholesterol    High risk medication use 08/13/2016   History of pulmonary embolism 12/29/2019   Hyperlipemia 12/24/2016   Hypertensive crisis 05/01/2020   Iron deficiency anemia 08/12/2016   Lumbar facet arthropathy 10/24/2019   Malaise and fatigue 08/13/2016   Morbid obesity (HCC) 11/25/2019   Obesity (BMI 30-39.9) 08/13/2016   Prediabetes 08/12/2016    Primary osteoarthritis of right knee 09/16/2019   Pulmonary emboli (HCC) 11/25/2019   Pulmonary embolism (HCC)    Pulmonary embolism and infarction (HCC) 11/25/2019   S/P arthroscopic surgery of right knee 03/13/2020   Tear of medial meniscus of right knee 08/26/2019   Vitamin B12 deficiency 08/21/2016    Past Surgical History:  Procedure Laterality Date   ABDOMINAL HYSTERECTOMY     CHOLECYSTECTOMY     KNEE ARTHROPLASTY       reports that she quit smoking about 19 years ago. Her smoking use included cigarettes. She has a 15.00 pack-year smoking history. She has never used smokeless tobacco. She reports that she does not currently use alcohol. She reports that she does not use drugs.  No Known Allergies  Family History  Problem Relation Age of Onset   Clotting disorder Neg Hx      Prior to Admission medications   Medication Sig Start Date End Date Taking? Authorizing Provider  albuterol (ACCUNEB) 0.63 MG/3ML nebulizer solution Take 3 mLs by nebulization as directed. 05/02/20   [provider]  amLODipine (NORVASC) 5 MG tablet Take 0.5 mg by mouth daily. 06/14/20   [provider]  atorvastatin (LIPITOR) 10 MG tablet Take 2 tablets (20 mg total) by mouth daily. 01/15/21   Georgeanna Lea, MD  furosemide (LASIX) 20 MG tablet Take 20 mg by mouth as needed for edema.    [provider]  hydroxychloroquine (PLAQUENIL) 200 MG tablet Take 400 mg by mouth daily. 11/01/19  [provider]  losartan (COZAAR) 50 MG tablet Take 2 tablets by mouth daily. For 30 days 10/09/20 04/02/21  [provider]  omeprazole (PRILOSEC OTC) 20 MG tablet Take 20 mg by mouth daily.    [provider]  XARELTO 20 MG TABS tablet Take 20 mg by mouth daily. 08/24/20   [provider]    Physical Exam: Vitals:   06/04/21 2200 06/04/21 2230 06/04/21 2300 06/04/21 2330  BP: (!) 143/83 (!) 124/105 134/75 (!) 148/83  Pulse: 79 78 71 77  Resp: (!) 24 (!) 21  19   Temp:      TempSrc:      SpO2: 99% 100% 97% 98%  Weight:      Height:        Constitutional: NAD, calm, comfortable Eyes: PERRL, lids and conjunctivae normal ENMT: Mucous membranes are moist. Posterior pharynx clear of any exudate or lesions.Normal dentition.  Neck: normal, supple, no masses, no thyromegaly Respiratory: CTA B, mild tachypnea Cardiovascular: Regular rate and rhythm, no murmurs / rubs / gallops. No extremity edema. 2+ pedal pulses. No carotid bruits.  Abdomen: no tenderness, no masses palpated. No hepatosplenomegaly. Bowel sounds positive.  Musculoskeletal: No significant RLE edema compared to Left, certainly no discoloration to suggest phlegmasia. Skin: no rashes, lesions, ulcers. No induration Neurologic: CN 2-12 grossly intact. Sensation intact, DTR normal. Strength 5/5 in all 4.  Psychiatric: Normal judgment and insight. Alert and oriented x 3. Normal mood.    Labs on Admission: I have personally reviewed following labs and imaging studies  CBC: Recent Labs  Lab 06/04/21 1833  WBC 6.0  NEUTROABS 3.7  HGB 13.4  HCT 41.6  MCV 87.9  PLT 262   Basic Metabolic Panel: Recent Labs  Lab 06/04/21 1833  NA 139  K 3.6  CL 104  CO2 23  GLUCOSE 89  BUN 11  CREATININE 0.75  CALCIUM 9.4   GFR: Estimated Creatinine Clearance: 101.7 mL/min (by C-G formula based on SCr of 0.75 mg/dL). Liver Function Tests: No results for input(s): AST, ALT, ALKPHOS, BILITOT, PROT, ALBUMIN in the last 168 hours. No results for input(s): LIPASE, AMYLASE in the last 168 hours. No results for input(s): AMMONIA in the last 168 hours. Coagulation Profile: No results for input(s): INR, PROTIME in the last 168 hours. Cardiac Enzymes: No results for input(s): CKTOTAL, CKMB, CKMBINDEX, TROPONINI in the last 168 hours. BNP (last 3 results) Recent Labs    03/08/21 1141  PROBNP 65.0   HbA1C: No results for input(s): HGBA1C in the last 72 hours. CBG: No results for input(s):  GLUCAP in the last 168 hours. Lipid Profile: No results for input(s): CHOL, HDL, LDLCALC, TRIG, CHOLHDL, LDLDIRECT in the last 72 hours. Thyroid Function Tests: No results for input(s): TSH, T4TOTAL, FREET4, T3FREE, THYROIDAB in the last 72 hours. Anemia Panel: No results for input(s): VITAMINB12, FOLATE, FERRITIN, TIBC, IRON, RETICCTPCT in the last 72 hours. Urine analysis: No results found for: COLORURINE, APPEARANCEUR, LABSPEC, PHURINE, GLUCOSEU, HGBUR, BILIRUBINUR, KETONESUR, PROTEINUR, UROBILINOGEN, NITRITE, LEUKOCYTESUR  Radiological Exams on Admission: DG Chest 2 View  Result Date: 06/04/2021 CLINICAL DATA:  Shortness of breath EXAM: CHEST - 2 VIEW COMPARISON:  03/08/2021 FINDINGS: The heart size and mediastinal contours are within normal limits. Aortic atherosclerotic calcifications. Both lungs are clear. No acute osseous abnormality. IMPRESSION: No active cardiopulmonary disease. Electronically Signed   By: Wiliam Ke M.D.   On: 06/04/2021 19:25   CT Angio Chest PE W and/or Wo Contrast  Result  Date: 06/04/2021 CLINICAL DATA:  PE suspected, high prob EXAM: CT ANGIOGRAPHY CHEST WITH CONTRAST TECHNIQUE: Multidetector CT imaging of the chest was performed using the standard protocol during bolus administration of intravenous contrast. Multiplanar CT image reconstructions and MIPs were obtained to evaluate the vascular anatomy. CONTRAST:  71mL OMNIPAQUE IOHEXOL 350 MG/ML SOLN COMPARISON:  05/01/2020 FINDINGS: Cardiovascular: Bilateral pulmonary emboli are noted involving all lobes of both lungs, most pronounced in the lower lobes, left greater than right. No evidence of right heart strain (RV to LV ratio 0.83). Heart is normal size. Aortic and coronary artery calcifications. Mediastinum/Nodes: No mediastinal, hilar, or axillary adenopathy. Trachea and esophagus are unremarkable. Thyroid unremarkable. Lungs/Pleura: No confluent airspace opacities. Early ground-glass opacities peripherally in  both lower lobes could reflect atelectasis or early infarcts. No effusions. Upper Abdomen: Imaging into the upper abdomen demonstrates no acute findings. Musculoskeletal: Chest wall soft tissues are unremarkable. No acute bony abnormality. Review of the MIP images confirms the above findings. IMPRESSION: Numerous bilateral pulmonary emboli, most pronounced in the lower lobes, left greater than right. No evidence of right heart strain. Ground-glass peripheral opacities in the lower lobes could reflect atelectasis or early infarcts. Scattered coronary artery calcifications. Aortic Atherosclerosis (ICD10-I70.0). These results were called by telephone at the time of interpretation on 06/04/2021 at 10:16 pm to provider [facility] , who verbally acknowledged these results. Electronically Signed   By: Charlett Nose M.D.   On: 06/04/2021 22:19    EKG: Independently reviewed.  Assessment/Plan Principal Problem:   Acute pulmonary embolism (HCC) Active Problems:   Hyperlipemia   History of pulmonary embolism   HTN (hypertension)    Acute PEs and RLE DVTs - Unprovoked this time 3rd (at least) per patient episode of clotting, h/o large saddle PE just last year. Pt will need to be on lifelong anticoagulation. Heparin gtt for now 2d echo in AM Faxing OSH [place] results Call vasc surg in AM to see if they recommend doing any sort of thrombolysis of the RLE, also if they suggest IVC filter or not Unfortunately wasn't able to take daily xarelto due to cost SW consult to see what is affordable to patient. HTN - Cont amlodipine and losartan HLD - Cont statin  DVT prophylaxis: Heparin gtt Code Status: Full Family Communication: No family in room Disposition Plan: Home after cleared from PE/DVT perspective, with plan for chronic anticoagulation that's affordable Consults called: None Admission status: Admit to inpatient  Severity of Illness: The appropriate patient status for this patient is INPATIENT.  Inpatient status is judged to be reasonable and necessary in order to provide the required intensity of service to ensure the patient's safety. The patient's presenting symptoms, physical exam findings, and initial radiographic and laboratory data in the context of their chronic comorbidities is felt to place them at high risk for further clinical deterioration. Furthermore, it is not anticipated that the patient will be medically stable for discharge from the hospital within 2 midnights of admission. The following factors support the patient status of inpatient.   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 DO [facility]  How to contact the [facility] Attending or Consulting provider 7A - 7P  or covering provider during after hours 7P -7A, for this patient?  Check the care team in [facility] and look for a) attending/consulting [facility] provider listed and b) the [facility] team listed Log into [email or web address]  Amion Physician Scheduling and messaging for groups and whole hospitals  On call and physician scheduling software for group practices, residents, hospitalists and other medical providers for call, clinic, rotation and shift schedules. OnCall Enterprise is a hospital-wide system for scheduling doctors and paging doctors on call. EasyPlot is for scientific plotting and data analysis.  [email or web address]  and use [place]'s universal password to access. If you do not have the password, please contact the hospital operator.  Locate the [facility] provider you are looking for under [facility] and page to a number that you can be directly reached. If you still have difficulty reaching the provider, please  page the [facility] (Director on Call) for the Hospitalists listed on amion for assistance.  06/04/2021, 11:44 PM

## 2021-06-04 NOTE — Progress Notes (Signed)
ANTICOAGULATION CONSULT NOTE   Pharmacy Consult for Heparin Indication: pulmonary embolus  No Known Allergies  Patient Measurements: Height: 5\' 8"  (172.7 cm) Weight: 104.3 kg (230 lb) IBW/kg (Calculated) : 63.9 Heparin Dosing Weight: 87.2 kg  Vital Signs: Temp: 97.7 F (36.5 C) (09/06 2146) Temp Source: Oral (09/06 2146) BP: 148/83 (09/06 2330) Pulse Rate: 77 (09/06 2330)  Labs: Recent Labs    06/04/21 1833 06/04/21 2026 06/04/21 2240  HGB 13.4  --   --   HCT 41.6  --   --   PLT 262  --   --   HEPARINUNFRC  --   --  <0.10*  CREATININE 0.75  --   --   TROPONINIHS 7 6  --      Estimated Creatinine Clearance: 101.7 mL/min (by C-G formula based on SCr of 0.75 mg/dL).   Medical History: Past Medical History:  Diagnosis Date   Abdominal pain 10/11/2015   Aortic atherosclerosis (HCC) 09/27/2016   Formatting of this note might be different from the original. Seen on chest x-ray in 08/2016.   Bilateral lower extremity edema 06/14/2020   Chronic cough 03/08/2021   Chronic pain 11/25/2019   DDD (degenerative disc disease), lumbar 10/24/2019   Dyspnea on exertion 12/29/2019   Gallstone 10/11/2015   GERD (gastroesophageal reflux disease)    Hiatal hernia 08/12/2016   High blood cholesterol    High risk medication use 08/13/2016   History of pulmonary embolism 12/29/2019   Hyperlipemia 12/24/2016   Hypertensive crisis 05/01/2020   Iron deficiency anemia 08/12/2016   Lumbar facet arthropathy 10/24/2019   Malaise and fatigue 08/13/2016   Morbid obesity (HCC) 11/25/2019   Obesity (BMI 30-39.9) 08/13/2016   Prediabetes 08/12/2016   Primary osteoarthritis of right knee 09/16/2019   Pulmonary emboli (HCC) 11/25/2019   Pulmonary embolism (HCC)    Pulmonary embolism and infarction (HCC) 11/25/2019   S/P arthroscopic surgery of right knee 03/13/2020   Tear of medial meniscus of right knee 08/26/2019   Vitamin B12 deficiency 08/21/2016    Assessment: 89 yof with a history of PE on  Xarelto. Patient is presenting with SOB. Patient with outpatient 57 in [place] earlier today positive for DVT. Heparin per pharmacy consult placed for  PE .  CT angio chest w/ Numerous bilateral pulmonary emboli, most pronounced in the lower lobes, left greater than right  Patient is supposed to be taking xarelto; however, patient states that she frequently takes every other day (sometimes less frequently) due to cost. Last taken 9/5 per patient.  Baseline heparin level undetectable so no need for PTT monitoring  Goal of Therapy:  Heparin level 0.3-0.7 units/ml aPTT 66-102 Monitor platelets by anticoagulation protocol: Yes   Plan:  Bolus heparin 5000 units now with undetectable initial heparin level Continue heparin infusion at 1600 units/hr Will f/u 6 hr heparin level  [place], PharmD, BCPS Please see amion for complete clinical pharmacist phone list 06/04/2021 11:58 PM

## 2021-06-04 NOTE — ED Notes (Signed)
Pt to CT

## 2021-06-04 NOTE — Telephone Encounter (Signed)
Sue Lush from Dr. Anders Grant office states the patient had a 1 year follow up scan. She says it shows acute DVT in right femoral popliteal and posterior tibial veins, but no evidence in left. She states she will fax the results. She would like a nurse or cma to call her back at: [phone] or her direct line: [phone]

## 2021-06-04 NOTE — Telephone Encounter (Signed)
Spoke to Sue Lush just now and she let me know that when she called the patient today the patient was symptomatic and they sent her to the ED. She just wanted to make Dr. Bing Matter aware of this.

## 2021-06-04 NOTE — ED Triage Notes (Signed)
Pt presents for recent dx of 3 blood clots. Blood clots were noted today after [place] completed this am per PCP request. Pt also endorses SOB x1 week. Pt with a hx of chronic blood clots and currently taking xeralto

## 2021-06-04 NOTE — ED Provider Notes (Signed)
Emergency Medicine Provider Triage Evaluation Note  Meagan Doyle , a 55 y.o. female  was evaluated in triage.  Pt complains of shortness of breath and DVT to right lower extremity.  Patient reports that she had ultrasound imaging performed earlier today which showed DVT in right femoral, popliteal and posterior tibial veins.  Patient endorses shortness of breath x1 week.  Denies any hemoptysis, cough, chest pain.  Patient reports that she is currently taking Xarelto, last took Xarelto yesterday evening.  Review of Systems  Positive: Shortness of breath, leg swelling, leg tenderness Negative: Chest pain, hemoptysis, lightheadedness, syncope  Physical Exam  BP (!) 155/109   Pulse 82   Temp 98.9 F (37.2 C) (Oral)   Resp 18   SpO2 99%  Gen:   Awake, no distress   Resp:  Normal effort; lungs clear to auscultation bilaterally. MSK:   Moves extremities without difficulty; tenderness to right lower extremity Other:    Medical Decision Making  Medically screening exam initiated at 6:26 PM.  Appropriate orders placed.  Walker Shadow was informed that the remainder of the evaluation will be completed by another provider, this initial triage assessment does not replace that evaluation, and the importance of remaining in the ED until their evaluation is complete.    Haskel Schroeder, PA-C 06/04/21 1900    Maia Plan, MD 06/04/21 2328

## 2021-06-05 ENCOUNTER — Inpatient Hospital Stay (HOSPITAL_COMMUNITY): Payer: Commercial Managed Care - PPO

## 2021-06-05 ENCOUNTER — Encounter (HOSPITAL_COMMUNITY): Payer: Self-pay | Admitting: Internal Medicine

## 2021-06-05 ENCOUNTER — Encounter (HOSPITAL_COMMUNITY): Payer: Commercial Managed Care - PPO

## 2021-06-05 ENCOUNTER — Other Ambulatory Visit (HOSPITAL_COMMUNITY): Payer: Self-pay

## 2021-06-05 DIAGNOSIS — I2609 Other pulmonary embolism with acute cor pulmonale: Secondary | ICD-10-CM

## 2021-06-05 LAB — CBC
HCT: 37.9 % (ref 36.0–46.0)
Hemoglobin: 12.1 g/dL (ref 12.0–15.0)
MCH: 28.5 pg (ref 26.0–34.0)
MCHC: 31.9 g/dL (ref 30.0–36.0)
MCV: 89.4 fL (ref 80.0–100.0)
Platelets: 218 10*3/uL (ref 150–400)
RBC: 4.24 MIL/uL (ref 3.87–5.11)
RDW: 14.8 % (ref 11.5–15.5)
WBC: 4.8 10*3/uL (ref 4.0–10.5)
nRBC: 0 % (ref 0.0–0.2)

## 2021-06-05 LAB — BASIC METABOLIC PANEL
Anion gap: 9 (ref 5–15)
BUN: 12 mg/dL (ref 6–20)
CO2: 23 mmol/L (ref 22–32)
Calcium: 8.9 mg/dL (ref 8.9–10.3)
Chloride: 107 mmol/L (ref 98–111)
Creatinine, Ser: 0.71 mg/dL (ref 0.44–1.00)
GFR, Estimated: >60 mL/min (ref >60)
Glucose, Bld: 102 mg/dL — ABNORMAL HIGH (ref 70–99)
Potassium: 3.5 mmol/L (ref 3.5–5.1)
Sodium: 139 mmol/L (ref 135–145)

## 2021-06-05 LAB — HEPARIN LEVEL (UNFRACTIONATED)
Heparin Unfractionated: 0.83 IU/mL — ABNORMAL HIGH (ref 0.30–0.70)
Heparin Unfractionated: 0.61 IU/mL (ref 0.30–0.70)
Heparin Unfractionated: 0.47 IU/mL (ref 0.30–0.70)

## 2021-06-05 LAB — PROTIME-INR
INR: 1.1 (ref 0.8–1.2)
Prothrombin Time: 13.8 seconds (ref 11.4–15.2)

## 2021-06-05 LAB — ECHOCARDIOGRAM COMPLETE
Area-P 1/2: 1.93 cm2
Height: 68 in
S' Lateral: 3.5 cm
Weight: 3680 oz

## 2021-06-05 LAB — APTT: aPTT: 25 seconds (ref 24–36)

## 2021-06-05 MED ORDER — WARFARIN - PHARMACIST DOSING INPATIENT: Freq: Every day | Status: DC

## 2021-06-05 MED ORDER — PANTOPRAZOLE SODIUM 40 MG PO TBEC
40.0000 mg | DELAYED_RELEASE_TABLET | Freq: Every day | ORAL | Status: DC
  Administered 2021-06-05 – 2021-06-06 (×2): 40 mg via ORAL
  Filled 2021-06-05 (×2): qty 1

## 2021-06-05 MED ORDER — COUMADIN BOOK
Freq: Once | Status: AC
  Filled 2021-06-05: qty 1

## 2021-06-05 MED ORDER — WARFARIN VIDEO: Freq: Once | Status: AC

## 2021-06-05 MED ORDER — HEPARIN BOLUS VIA INFUSION
5000.0000 [IU] | Freq: Once | INTRAVENOUS | Status: AC
  Administered 2021-06-05: 5000 [IU] via INTRAVENOUS
  Filled 2021-06-05: qty 5000

## 2021-06-05 MED ORDER — WARFARIN SODIUM 7.5 MG PO TABS
7.5000 mg | ORAL_TABLET | Freq: Once | ORAL | Status: AC
  Administered 2021-06-05: 7.5 mg via ORAL
  Filled 2021-06-05: qty 1

## 2021-06-05 NOTE — Progress Notes (Signed)
ANTICOAGULATION CONSULT NOTE   Pharmacy Consult for Heparin Indication: pulmonary embolus  No Known Allergies  Patient Measurements: Height: 5\' 8"  (172.7 cm) Weight: 104.3 kg (230 lb) IBW/kg (Calculated) : 63.9 Heparin Dosing Weight: 87.2 kg  Vital Signs: Temp: 98.1 F (36.7 C) (09/07 0737) Temp Source: Oral (09/07 0737) BP: 109/58 (09/07 0830) Pulse Rate: 68 (09/07 0830)  Labs: Recent Labs    06/04/21 1833 06/04/21 2026 06/04/21 2239 06/04/21 2240 06/05/21 0805 06/05/21 0808  HGB 13.4  --   --   --  12.1  --   HCT 41.6  --   --   --  37.9  --   PLT 262  --   --   --  218  --   APTT  --   --  25  --   --   --   LABPROT  --   --  13.8  --   --   --   INR  --   --  1.1  --   --   --   HEPARINUNFRC  --   --   --  <0.10*  --  0.83*  CREATININE 0.75  --   --   --  0.71  --   TROPONINIHS 7 6  --   --   --   --      Estimated Creatinine Clearance: 101.7 mL/min (by C-G formula based on SCr of 0.71 mg/dL).   Medical History: Past Medical History:  Diagnosis Date   Abdominal pain 10/11/2015   Aortic atherosclerosis (HCC) 09/27/2016   Formatting of this note might be different from the original. Seen on chest x-ray in 08/2016.   Bilateral lower extremity edema 06/14/2020   Chronic cough 03/08/2021   Chronic pain 11/25/2019   DDD (degenerative disc disease), lumbar 10/24/2019   Dyspnea on exertion 12/29/2019   Gallstone 10/11/2015   GERD (gastroesophageal reflux disease)    Hiatal hernia 08/12/2016   High blood cholesterol    High risk medication use 08/13/2016   History of pulmonary embolism 12/29/2019   Hyperlipemia 12/24/2016   Hypertensive crisis 05/01/2020   Iron deficiency anemia 08/12/2016   Lumbar facet arthropathy 10/24/2019   Malaise and fatigue 08/13/2016   Morbid obesity (HCC) 11/25/2019   Obesity (BMI 30-39.9) 08/13/2016   Prediabetes 08/12/2016   Primary osteoarthritis of right knee 09/16/2019   Pulmonary emboli (HCC) 11/25/2019   Pulmonary embolism (HCC)     Pulmonary embolism and infarction (HCC) 11/25/2019   S/P arthroscopic surgery of right knee 03/13/2020   Tear of medial meniscus of right knee 08/26/2019   Vitamin B12 deficiency 08/21/2016    Assessment: 70 yof with a history of PE on Xarelto. Patient is presenting with SOB. Patient with outpatient 57 in [place] earlier today positive for DVT. Heparin per pharmacy consult placed for  PE .  CT angio chest w/ Numerous bilateral pulmonary emboli, most pronounced in the lower lobes, left greater than right  Patient is supposed to be taking xarelto; however, patient states that she frequently takes every other day (sometimes less frequently) due to cost. Last taken 9/5 per patient.> Baseline heparin level undetectable so no need for PTT monitoring  Heparin level supratherapeutic at 0.83 on 1600 units/hr  Goal of Therapy:  Heparin level 0.3-0.7 units/ml Monitor platelets by anticoagulation protocol: Yes   Plan:  Decrease heparin gtt to 1500 units/hr F/u 6 hour heparin level  [place], PharmD Clinical Pharmacist ED Pharmacist Phone # [phone] 06/05/2021 8:53  AM

## 2021-06-05 NOTE — Progress Notes (Signed)
PROGRESS NOTE    Meagan Doyle  PZW:[id number] DOB: 02/07/1966 DOA: 06/04/2021 PCP: Audie Pinto, FNP   Chief Complaint  Patient presents with   Shortness of Breath   Brief Narrative:  Meagan Doyle is Meagan Doyle 55 y.o. 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   Pt prescribed Xarelto but admits to only taking Xarelto every other day due to extreme cost.     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  Assessment & Plan:   Principal Problem:   Acute pulmonary embolism (HCC) Active Problems:   Hyperlipemia   History of pulmonary embolism   HTN (hypertension)  Acute PEs and RLE DVTs - In the setting of non adherence to xarelto (taking every other day) She's had 2 prior VTE, both seem to have been in setting of recent surgeries She's candidate for indefinite/lifelong anticoagulation given her recurrent VTE Continue heparin gtt for now PESI 54, class 1 - though I think considering her history her risk is higher (in setting of hx of saddle PE and issues with compliance), she's not Zen Cedillos candidate for outpatient management CT PE protocol with numerous bilateral PE, L>R, no evidence of right heart strain Follow echocardiogram -> EF with EF 60-65%, grade 1 diastolic dysfunction, RVSF normal with normal PASP Discussed [place] results with radiology, noted common femoral not involved, mostly distal femoral involvement - attempting to get records faxed - no clear indication at this time for intervention without evidence phlegmasia or iliofemoral involvement, though will follow report.  Also, as patient did not fail anticoagulation, I don't think IVC filter indicated.  Discussed informally with vascular who agreed. Xarelto 298 dollars for 90 days, eliquis over 100 dollars for 1 month (per discussion with pharmacy).  Will plan for warfarin.   Given only distal femoral vein involvement per my discussion with rads, I don't think she's  candidate for any intervention (will work on getting records prior to discussing with vascular).   She'll need follow up with heme outpatient   HTN - Cont amlodipine and losartan  HLD - Cont statin  DVT prophylaxis: heparin  Code Status: full  Family Communication: none at bedside Disposition:   Status is: Inpatient  Remains inpatient appropriate because:Inpatient level of care appropriate due to severity of illness  Dispo: The patient is from: Home              Anticipated d/c is to: Home              Patient currently is not medically stable to d/c.   Difficult to place patient No       Consultants:  none  Procedures:  None   Antimicrobials:  Anti-infectives (From admission, onward)    Start     Dose/Rate Route Frequency Ordered Stop   06/05/21 1000  hydroxychloroquine (PLAQUENIL) tablet 400 mg        400 mg Oral Daily 06/04/21 2354            Subjective: C/o SOB and RLE swelling x1 week  Objective: Vitals:   06/05/21 0600 06/05/21 0737 06/05/21 0830 06/05/21 0915  BP: 107/65 118/69 (!) 109/58 109/78  Pulse: 77 68 68 61  Resp: 17 15 18 20   Temp:  98.1 F (36.7 C)    TempSrc:  Oral    SpO2: 95% 96% 93% 93%  Weight:      Height:       No intake or output data  in the 24 hours ending 06/05/21 0931 Filed Weights   06/04/21 2146  Weight: 104.3 kg    Examination:  General exam: Appears calm and comfortable  Respiratory system: unlabored Cardiovascular system: RRR Gastrointestinal system: Abdomen is nondistended, soft and nontender.  Central nervous system: Alert and oriented. No focal neurological deficits. Extremities: bilateral LE edema, R>L - palpable distal DP pulses bilaterally Skin: No rashes, lesions or ulcers Psychiatry: Judgement and insight appear normal. Mood & affect appropriate.     Data Reviewed: I have personally reviewed following labs and imaging studies  CBC: Recent Labs  Lab 06/04/21 1833 06/05/21 0805  WBC 6.0 4.8   NEUTROABS 3.7  --   HGB 13.4 12.1  HCT 41.6 37.9  MCV 87.9 89.4  PLT 262 218    Basic Metabolic Panel: Recent Labs  Lab 06/04/21 1833 06/05/21 0805  NA 139 139  K 3.6 3.5  CL 104 107  CO2 23 23  GLUCOSE 89 102*  BUN 11 12  CREATININE 0.75 0.71  CALCIUM 9.4 8.9    GFR: Estimated Creatinine Clearance: 101.7 mL/min (by C-G formula based on SCr of 0.71 mg/dL).  Liver Function Tests: No results for input(s): AST, ALT, ALKPHOS, BILITOT, PROT, ALBUMIN in the last 168 hours.  CBG: No results for input(s): GLUCAP in the last 168 hours.   Recent Results (from the past 240 hour(s))  Resp Panel by RT-PCR (Flu Jarelis Ehlert&B, Covid) Nasopharyngeal Swab     Status: None   Collection Time: 06/04/21  7:30 PM   Specimen: Nasopharyngeal Swab; Nasopharyngeal(NP) swabs in vial transport medium  Result Value Ref Range Status   SARS Coronavirus 2 by RT PCR NEGATIVE NEGATIVE Final    Comment: (NOTE) SARS-CoV-2 target nucleic acids are NOT DETECTED.  The SARS-CoV-2 RNA is generally detectable in upper respiratory specimens during the acute phase of infection. The lowest concentration of SARS-CoV-2 viral copies this assay can detect is 138 copies/mL. Heike Pounds negative result does not preclude SARS-Cov-2 infection and should not be used as the sole basis for treatment or other patient management decisions. Aviv Lengacher negative result may occur with  improper specimen collection/handling, submission of specimen other than nasopharyngeal swab, presence of viral mutation(s) within the areas targeted by this assay, and inadequate number of viral copies(<138 copies/mL). Verlon Pischke negative result must be combined with clinical observations, patient history, and epidemiological information. The expected result is Negative.  Fact Sheet for Patients:  BloggerCourse.com  Fact Sheet for Healthcare Providers:  SeriousBroker.it  This test is no t yet approved or cleared by the  [place] FDA and  has been authorized for detection and/or diagnosis of SARS-CoV-2 by FDA under an Emergency Use Authorization (EUA). This EUA will remain  in effect (meaning this test can be used) for the duration of the COVID-19 declaration under Section 564(b)(1) of the Act, 21 U.S.C.section 360bbb-3(b)(1), unless the authorization is terminated  or revoked sooner.       Influenza Katheline Brendlinger by PCR NEGATIVE NEGATIVE Final   Influenza B by PCR NEGATIVE NEGATIVE Final    Comment: (NOTE) The Xpert Xpress SARS-CoV-2/FLU/RSV plus assay is intended as an aid in the diagnosis of influenza from Nasopharyngeal swab specimens and should not be used as Matthe Sloane sole basis for treatment. Nasal washings and aspirates are unacceptable for Xpert Xpress SARS-CoV-2/FLU/RSV testing.  Fact Sheet for Patients: BloggerCourse.com  Fact Sheet for Healthcare Providers: SeriousBroker.it  This test is not yet approved or cleared by the [place] FDA and has been authorized for detection  and/or diagnosis of SARS-CoV-2 by FDA under an Emergency Use Authorization (EUA). This EUA will remain in effect (meaning this test can be used) for the duration of the COVID-19 declaration under Section 564(b)(1) of the Act, 21 U.S.C. section 360bbb-3(b)(1), unless the authorization is terminated or revoked.  Performed at [facility], [address] [place], [place] [address]          Radiology Studies: DG Chest 2 View  Result Date: 06/04/2021 CLINICAL DATA:  Shortness of breath EXAM: CHEST - 2 VIEW COMPARISON:  03/08/2021 FINDINGS: The heart size and mediastinal contours are within normal limits. Aortic atherosclerotic calcifications. Both lungs are clear. No acute osseous abnormality. IMPRESSION: No active cardiopulmonary disease. Electronically Signed   By: Wiliam Ke M.D.   On: 06/04/2021 19:25   CT Angio Chest PE W and/or Wo Contrast  Result Date:  06/04/2021 CLINICAL DATA:  PE suspected, high prob EXAM: CT ANGIOGRAPHY CHEST WITH CONTRAST TECHNIQUE: Multidetector CT imaging of the chest was performed using the standard protocol during bolus administration of intravenous contrast. Multiplanar CT image reconstructions and MIPs were obtained to evaluate the vascular anatomy. CONTRAST:  30mL OMNIPAQUE IOHEXOL 350 MG/ML SOLN COMPARISON:  05/01/2020 FINDINGS: Cardiovascular: Bilateral pulmonary emboli are noted involving all lobes of both lungs, most pronounced in the lower lobes, left greater than right. No evidence of right heart strain (RV to LV ratio 0.83). Heart is normal size. Aortic and coronary artery calcifications. Mediastinum/Nodes: No mediastinal, hilar, or axillary adenopathy. Trachea and esophagus are unremarkable. Thyroid unremarkable. Lungs/Pleura: No confluent airspace opacities. Early ground-glass opacities peripherally in both lower lobes could reflect atelectasis or early infarcts. No effusions. Upper Abdomen: Imaging into the upper abdomen demonstrates no acute findings. Musculoskeletal: Chest wall soft tissues are unremarkable. No acute bony abnormality. Review of the MIP images confirms the above findings. IMPRESSION: Numerous bilateral pulmonary emboli, most pronounced in the lower lobes, left greater than right. No evidence of right heart strain. Ground-glass peripheral opacities in the lower lobes could reflect atelectasis or early infarcts. Scattered coronary artery calcifications. Aortic Atherosclerosis (ICD10-I70.0). These results were called by telephone at the time of interpretation on 06/04/2021 at 10:16 pm to provider [facility] , who verbally acknowledged these results. Electronically Signed   By: Charlett Nose M.D.   On: 06/04/2021 22:19        Scheduled Meds:  amLODipine  2.5 mg Oral Daily   atorvastatin  10 mg Oral Daily   hydroxychloroquine  400 mg Oral Daily   losartan  50 mg Oral Daily   pantoprazole  40 mg Oral  Daily   Continuous Infusions:  heparin 1,500 Units/hr (06/05/21 0917)     LOS: 1 day    Time spent: over 30 min    Lacretia Nicks, MD [facility]   To contact the attending provider between 7A-7P or the covering provider during after hours 7P-7A, please log into the web site [email or web address] and access using universal [place] password for that web site. If you do not have the password, please call the hospital operator.  06/05/2021, 9:31 AM

## 2021-06-05 NOTE — ED Notes (Signed)
Pt is asking for food. MD sent a message at this time if pt can eat. Waiting for response.

## 2021-06-05 NOTE — Progress Notes (Signed)
ANTICOAGULATION CONSULT NOTE   Pharmacy Consult for Heparin/warfarin Indication: pulmonary embolus  No Known Allergies  Patient Measurements: Height: 5\' 8"  (172.7 cm) Weight: 104.3 kg (230 lb) IBW/kg (Calculated) : 63.9 Heparin Dosing Weight: 87.2 kg  Vital Signs: Temp: 99 F (37.2 C) (09/07 1634) Temp Source: Axillary (09/07 1634) BP: 117/72 (09/07 1634) Pulse Rate: 75 (09/07 1634)  Labs: Recent Labs    06/04/21 1833 06/04/21 2026 06/04/21 2239 06/04/21 2240 06/05/21 0805 06/05/21 0808 06/05/21 1500 06/05/21 2055  HGB 13.4  --   --   --  12.1  --   --   --   HCT 41.6  --   --   --  37.9  --   --   --   PLT 262  --   --   --  218  --   --   --   APTT  --   --  25  --   --   --   --   --   LABPROT  --   --  13.8  --   --   --   --   --   INR  --   --  1.1  --   --   --   --   --   HEPARINUNFRC  --   --   --    < >  --  0.83* 0.61 0.47  CREATININE 0.75  --   --   --  0.71  --   --   --   TROPONINIHS 7 6  --   --   --   --   --   --    < > = values in this interval not displayed.     Estimated Creatinine Clearance: 101.7 mL/min (by C-G formula based on SCr of 0.71 mg/dL).   Medical History: Past Medical History:  Diagnosis Date   Abdominal pain 10/11/2015   Aortic atherosclerosis (HCC) 09/27/2016   Formatting of this note might be different from the original. Seen on chest x-ray in 08/2016.   Bilateral lower extremity edema 06/14/2020   Chronic cough 03/08/2021   Chronic pain 11/25/2019   DDD (degenerative disc disease), lumbar 10/24/2019   Dyspnea on exertion 12/29/2019   Gallstone 10/11/2015   GERD (gastroesophageal reflux disease)    Hiatal hernia 08/12/2016   High blood cholesterol    High risk medication use 08/13/2016   History of pulmonary embolism 12/29/2019   Hyperlipemia 12/24/2016   Hypertensive crisis 05/01/2020   Iron deficiency anemia 08/12/2016   Lumbar facet arthropathy 10/24/2019   Malaise and fatigue 08/13/2016   Morbid obesity (HCC) 11/25/2019    Obesity (BMI 30-39.9) 08/13/2016   Prediabetes 08/12/2016   Primary osteoarthritis of right knee 09/16/2019   Pulmonary emboli (HCC) 11/25/2019   Pulmonary embolism (HCC)    Pulmonary embolism and infarction (HCC) 11/25/2019   S/P arthroscopic surgery of right knee 03/13/2020   Tear of medial meniscus of right knee 08/26/2019   Vitamin B12 deficiency 08/21/2016    Assessment: 62 yof with a history of PE on Xarelto. Patient is presenting with SOB. Patient with outpatient 57 in [place] earlier today positive for DVT. Heparin/warfarin per pharmacy consult placed for  PE .  Imaging: - CT angio chest w/ Numerous bilateral pulmonary emboli, most pronounced in the lower lobes, left greater than right  Patient is supposed to be taking xarelto; however, patient states that she frequently takes every other day (sometimes less frequently)  due to cost. Last taken 9/5 per patient.> Baseline heparin level undetectable so no need for PTT monitoring  -New start warfarin patient along with heparin bridge  9/7 heparin level this evening remains in goal range.  No overt bleeding or complications noted.  Goal of Therapy:  Heparin level 0.3-0.7 units/ml INR goal: 2-3 Monitor platelets by anticoagulation protocol: Yes   Plan:  -Continue heparin drip @ 1500 units/hr until INR within goal range. -Daily heparin level and CBC.   Reece Leader, Colon Flattery, BCCP Clinical Pharmacist  06/05/2021 9:27 PM   [facility] pharmacy phone numbers are listed on [email or web address]

## 2021-06-05 NOTE — Plan of Care (Signed)

## 2021-06-05 NOTE — Progress Notes (Signed)
ANTICOAGULATION CONSULT NOTE   Pharmacy Consult for Heparin/warfarin Indication: pulmonary embolus  No Known Allergies  Patient Measurements: Height: 5\' 8"  (172.7 cm) Weight: 104.3 kg (230 lb) IBW/kg (Calculated) : 63.9 Heparin Dosing Weight: 87.2 kg  Vital Signs: Temp: 98.1 F (36.7 C) (09/07 1522) Temp Source: Oral (09/07 1522) BP: 109/61 (09/07 1522) Pulse Rate: 76 (09/07 1522)  Labs: Recent Labs    06/04/21 1833 06/04/21 2026 06/04/21 2239 06/04/21 2240 06/05/21 0805 06/05/21 0808 06/05/21 1500  HGB 13.4  --   --   --  12.1  --   --   HCT 41.6  --   --   --  37.9  --   --   PLT 262  --   --   --  218  --   --   APTT  --   --  25  --   --   --   --   LABPROT  --   --  13.8  --   --   --   --   INR  --   --  1.1  --   --   --   --   HEPARINUNFRC  --   --   --  <0.10*  --  0.83* 0.61  CREATININE 0.75  --   --   --  0.71  --   --   TROPONINIHS 7 6  --   --   --   --   --      Estimated Creatinine Clearance: 101.7 mL/min (by C-G formula based on SCr of 0.71 mg/dL).   Medical History: Past Medical History:  Diagnosis Date   Abdominal pain 10/11/2015   Aortic atherosclerosis (HCC) 09/27/2016   Formatting of this note might be different from the original. Seen on chest x-ray in 08/2016.   Bilateral lower extremity edema 06/14/2020   Chronic cough 03/08/2021   Chronic pain 11/25/2019   DDD (degenerative disc disease), lumbar 10/24/2019   Dyspnea on exertion 12/29/2019   Gallstone 10/11/2015   GERD (gastroesophageal reflux disease)    Hiatal hernia 08/12/2016   High blood cholesterol    High risk medication use 08/13/2016   History of pulmonary embolism 12/29/2019   Hyperlipemia 12/24/2016   Hypertensive crisis 05/01/2020   Iron deficiency anemia 08/12/2016   Lumbar facet arthropathy 10/24/2019   Malaise and fatigue 08/13/2016   Morbid obesity (HCC) 11/25/2019   Obesity (BMI 30-39.9) 08/13/2016   Prediabetes 08/12/2016   Primary osteoarthritis of right knee 09/16/2019    Pulmonary emboli (HCC) 11/25/2019   Pulmonary embolism (HCC)    Pulmonary embolism and infarction (HCC) 11/25/2019   S/P arthroscopic surgery of right knee 03/13/2020   Tear of medial meniscus of right knee 08/26/2019   Vitamin B12 deficiency 08/21/2016    Assessment: 33 yof with a history of PE on Xarelto. Patient is presenting with SOB. Patient with outpatient 58 in [place] earlier today positive for DVT. Heparin/warfarin per pharmacy consult placed for  PE .  Imaging: - CT angio chest w/ Numerous bilateral pulmonary emboli, most pronounced in the lower lobes, left greater than right  Patient is supposed to be taking xarelto; however, patient states that she frequently takes every other day (sometimes less frequently) due to cost. Last taken 9/5 per patient.> Baseline heparin level undetectable so no need for PTT monitoring  -New start warfarin patient along with heparin bridge  9/7 HL @1500  0.61  units/mL  Goal of Therapy:  Heparin level  0.3-0.7 units/ml INR goal: 2-3 Monitor platelets by anticoagulation protocol: Yes   Plan:  -Based on patient specific factors, will start warfarin 7.5mg  po x1. -Continue heparin drip @ 1500 units/hr until INR within goal range. -Will obtain HL @2100  -Plan for daily HL along with daily INR -Monitor for signs and symptoms of bleeding  **Warfarin book and video ordered 9/7**  11/7, PharmD Clinical Pharmacist  Please check AMION for all [facility] numbers After 10:00 PM, call Main Pharmacy [phone]

## 2021-06-05 NOTE — ED Notes (Signed)
Lunch tray ordered 

## 2021-06-05 NOTE — TOC Benefit Eligibility Note (Signed)
Patient Product/process development scientist completed.    The patient is currently admitted and upon discharge could be taking Eliquis 5 mg.  The current 30 day co-pay is, $101.97.   The patient is insured through [employer]      Roland Earl, CPhT Pharmacy Patient Advocate Specialist [facility] Antimicrobial Stewardship Team Direct Number: [phone]  Fax: [phone]

## 2021-06-05 NOTE — ED Notes (Addendum)
MD paged at this time for diet order. MD states NPO status for now. MD also notified that pt has not eaten since yesterday and if MD wants IVF. Waiting for new orders.

## 2021-06-05 NOTE — Progress Notes (Signed)
Echocardiogram 2D Echocardiogram has been performed.  Meagan Doyle 06/05/2021, 2:34 PM

## 2021-06-06 ENCOUNTER — Other Ambulatory Visit: Payer: Self-pay | Admitting: Oncology

## 2021-06-06 ENCOUNTER — Telehealth: Payer: Self-pay | Admitting: Oncology

## 2021-06-06 DIAGNOSIS — Z86711 Personal history of pulmonary embolism: Secondary | ICD-10-CM

## 2021-06-06 LAB — CBC WITH DIFFERENTIAL/PLATELET
Abs Immature Granulocytes: 0.01 10*3/uL (ref 0.00–0.07)
Basophils Absolute: 0 10*3/uL (ref 0.0–0.1)
Basophils Relative: 1 %
Eosinophils Absolute: 0.2 10*3/uL (ref 0.0–0.5)
Eosinophils Relative: 3 %
HCT: 36.8 % (ref 36.0–46.0)
Hemoglobin: 11.8 g/dL — ABNORMAL LOW (ref 12.0–15.0)
Immature Granulocytes: 0 %
Lymphocytes Relative: 44 %
Lymphs Abs: 2 10*3/uL (ref 0.7–4.0)
MCH: 28.4 pg (ref 26.0–34.0)
MCHC: 32.1 g/dL (ref 30.0–36.0)
MCV: 88.7 fL (ref 80.0–100.0)
Monocytes Absolute: 0.3 10*3/uL (ref 0.1–1.0)
Monocytes Relative: 5 %
Neutro Abs: 2.1 10*3/uL (ref 1.7–7.7)
Neutrophils Relative %: 47 %
Platelets: 243 10*3/uL (ref 150–400)
RBC: 4.15 MIL/uL (ref 3.87–5.11)
RDW: 14.8 % (ref 11.5–15.5)
WBC: 4.6 10*3/uL (ref 4.0–10.5)
nRBC: 0 % (ref 0.0–0.2)

## 2021-06-06 LAB — PROTIME-INR
INR: 1 (ref 0.8–1.2)
Prothrombin Time: 13.6 seconds (ref 11.4–15.2)

## 2021-06-06 LAB — COMPREHENSIVE METABOLIC PANEL
ALT: 17 U/L (ref 0–44)
AST: 15 U/L (ref 15–41)
Albumin: 3.7 g/dL (ref 3.5–5.0)
Alkaline Phosphatase: 78 U/L (ref 38–126)
Anion gap: 8 (ref 5–15)
BUN: 13 mg/dL (ref 6–20)
CO2: 26 mmol/L (ref 22–32)
Calcium: 9.3 mg/dL (ref 8.9–10.3)
Chloride: 106 mmol/L (ref 98–111)
Creatinine, Ser: 0.77 mg/dL (ref 0.44–1.00)
GFR, Estimated: >60 mL/min (ref >60)
Glucose, Bld: 98 mg/dL (ref 70–99)
Potassium: 3.7 mmol/L (ref 3.5–5.1)
Sodium: 140 mmol/L (ref 135–145)
Total Bilirubin: 0.9 mg/dL (ref 0.3–1.2)
Total Protein: 6.6 g/dL (ref 6.5–8.1)

## 2021-06-06 LAB — HEPARIN LEVEL (UNFRACTIONATED): Heparin Unfractionated: 0.69 IU/mL (ref 0.30–0.70)

## 2021-06-06 MED ORDER — ACETAMINOPHEN 325 MG PO TABS: 650.0000 mg | ORAL_TABLET | Freq: Four times a day (QID) | ORAL | Status: DC | PRN

## 2021-06-06 MED ORDER — ACETAMINOPHEN 650 MG RE SUPP: 650.0000 mg | Freq: Four times a day (QID) | RECTAL | Status: DC | PRN

## 2021-06-06 MED ORDER — ENOXAPARIN (LOVENOX) PATIENT EDUCATION KIT
PACK | Freq: Once | Status: AC
  Filled 2021-06-06: qty 1

## 2021-06-06 MED ORDER — ATORVASTATIN CALCIUM 10 MG PO TABS: 10.0000 mg | ORAL_TABLET | Freq: Every day | ORAL | 3 refills | Status: DC

## 2021-06-06 MED ORDER — WARFARIN SODIUM 7.5 MG PO TABS
7.5000 mg | ORAL_TABLET | Freq: Once | ORAL | Status: AC
  Administered 2021-06-06: 7.5 mg via ORAL
  Filled 2021-06-06: qty 1

## 2021-06-06 MED ORDER — ENOXAPARIN SODIUM 120 MG/0.8ML IJ SOSY
1.0000 mg/kg | PREFILLED_SYRINGE | Freq: Two times a day (BID) | INTRAMUSCULAR | Status: DC
  Administered 2021-06-06: 105 mg via SUBCUTANEOUS
  Filled 2021-06-06 (×2): qty 0.7

## 2021-06-06 MED ORDER — ENOXAPARIN SODIUM 120 MG/0.8ML IJ SOSY: 1.0000 mg/kg | PREFILLED_SYRINGE | Freq: Two times a day (BID) | INTRAMUSCULAR | 1 refills | Status: DC

## 2021-06-06 NOTE — Progress Notes (Signed)
Benefit check for xarelto and eliquis requested from [employer]. Daleen Squibb, MSW, LCSW 9/8/20224:05 PM

## 2021-06-06 NOTE — Discharge Instructions (Signed)
Information on my medicine - Coumadin®   (Warfarin) ° °This medication education was reviewed with me or my healthcare representative as part of my discharge preparation.   ° °Why was Coumadin prescribed for you? °Coumadin was prescribed for you because you have a blood clot or a medical condition that can cause an increased risk of forming blood clots. Blood clots can cause serious health problems by blocking the flow of blood to the heart, lung, or brain. Coumadin can prevent harmful blood clots from forming. °As a reminder your indication for Coumadin is:  Pulmonary Embolism treatment ° °What test will check on my response to Coumadin? °While on Coumadin (warfarin) you will need to have an INR test regularly to ensure that your dose is keeping you in the desired range. The INR (international normalized ratio) number is calculated from the result of the laboratory test called prothrombin time (PT). ° °If an INR APPOINTMENT HAS NOT ALREADY BEEN MADE FOR YOU please schedule an appointment to have this lab work done by your health care provider within 7 days. °Your INR goal is usually a number between:  2 to 3 or your provider may give you a more narrow range like 2-2.5.  Ask your health care provider during an office visit what your goal INR is. ° °What  do you need to  know  About  COUMADIN? °Take Coumadin (warfarin) exactly as prescribed by your healthcare provider about the same time each day.  DO NOT stop taking without talking to the doctor who prescribed the medication.  Stopping without other blood clot prevention medication to take the place of Coumadin may increase your risk of developing a new clot or stroke.  Get refills before you run out. ° °What do you do if you miss a dose? °If you miss a dose, take it as soon as you remember on the same day then continue your regularly scheduled regimen the next day.  Do not take two doses of Coumadin at the same time. ° °Important Safety Information °A possible side  effect of Coumadin (Warfarin) is an increased risk of bleeding. You should call your healthcare provider right away if you experience any of the following: °Bleeding from an injury or your nose that does not stop. °Unusual colored urine (red or dark brown) or unusual colored stools (red or black). °Unusual bruising for unknown reasons. °A serious fall or if you hit your head (even if there is no bleeding). ° °Some foods or medicines interact with Coumadin® (warfarin) and might alter your response to warfarin. To help avoid this: °Eat a balanced diet, maintaining a consistent amount of Vitamin K. °Notify your provider about major diet changes you plan to make. °Avoid alcohol or limit your intake to 1 drink for women and 2 drinks for men per day. °(1 drink is 5 oz. wine, 12 oz. beer, or 1.5 oz. liquor.) ° °Make sure that ANY health care provider who prescribes medication for you knows that you are taking Coumadin (warfarin).  Also make sure the healthcare provider who is monitoring your Coumadin knows when you have started a new medication including herbals and non-prescription products. ° °Coumadin® (Warfarin)  Major Drug Interactions  °Increased Warfarin Effect Decreased Warfarin Effect  °Alcohol (large quantities) °Antibiotics (esp. Septra/Bactrim, Flagyl, Cipro) °Amiodarone (Cordarone) °Aspirin (ASA) °Cimetidine (Tagamet) °Megestrol (Megace) °NSAIDs (ibuprofen, naproxen, etc.) °Piroxicam (Feldene) °Propafenone (Rythmol SR) °Propranolol (Inderal) °Isoniazid (INH) °Posaconazole (Noxafil) Barbiturates (Phenobarbital) °Carbamazepine (Tegretol) °Chlordiazepoxide (Librium) °Cholestyramine (Questran) °Griseofulvin °Oral Contraceptives °Rifampin °Sucralfate (  Carafate) °Vitamin K  ° °Coumadin® (Warfarin) Major Herbal Interactions  °Increased Warfarin Effect Decreased Warfarin Effect  °Garlic °Ginseng °Ginkgo biloba Coenzyme Q10 °Green tea °St. John’s wort   ° °Coumadin® (Warfarin) FOOD Interactions  °Eat a consistent  number of servings per week of foods HIGH in Vitamin K °(1 serving = ½ cup)  °Collards (cooked, or boiled & drained) °Kale (cooked, or boiled & drained) °Mustard greens (cooked, or boiled & drained) °Parsley *serving size only = ¼ cup °Spinach (cooked, or boiled & drained) °Swiss chard (cooked, or boiled & drained) °Turnip greens (cooked, or boiled & drained)  °Eat a consistent number of servings per week of foods MEDIUM-HIGH in Vitamin K °(1 serving = 1 cup)  °Asparagus (cooked, or boiled & drained) °Broccoli (cooked, boiled & drained, or raw & chopped) °Brussel sprouts (cooked, or boiled & drained) *serving size only = ½ cup °Lettuce, raw (green leaf, endive, romaine) °Spinach, raw °Turnip greens, raw & chopped  ° °These websites have more information on Coumadin (warfarin):  www.coumadin.com; °www.[place].gov/consumer/coumadin.htm; °  °

## 2021-06-06 NOTE — Telephone Encounter (Signed)
Patient referred by Dr Larose Hires for DVT.  Appt made for 06/20/21 Consult 10:15 am

## 2021-06-06 NOTE — Progress Notes (Signed)
ANTICOAGULATION CONSULT NOTE   Pharmacy Consult for Heparin/warfarin  Switch to Lovenox / Warfarin  Indication: acute pulmonary embolus  No Known Allergies  Patient Measurements: Height: 5\' 8"  (172.7 cm) Weight: 104.3 kg (230 lb) IBW/kg (Calculated) : 63.9 Heparin Dosing Weight: 87.2 kg  Vital Signs: Temp: 98.2 F (36.8 C) (09/08 0740) Temp Source: Oral (09/08 0740) BP: 127/63 (09/08 0740) Pulse Rate: 67 (09/08 0740)  Labs: Recent Labs    06/04/21 1833 06/04/21 2026 06/04/21 2239 06/04/21 2240 06/05/21 0805 06/05/21 0808 06/05/21 1500 06/05/21 2055 06/06/21 0328  HGB 13.4  --   --   --  12.1  --   --   --  11.8*  HCT 41.6  --   --   --  37.9  --   --   --  36.8  PLT 262  --   --   --  218  --   --   --  243  APTT  --   --  25  --   --   --   --   --   --   LABPROT  --   --  13.8  --   --   --   --   --  13.6  INR  --   --  1.1  --   --   --   --   --  1.0  HEPARINUNFRC  --   --   --    < >  --    < > 0.61 0.47 0.69  CREATININE 0.75  --   --   --  0.71  --   --   --  0.77  TROPONINIHS 7 6  --   --   --   --   --   --   --    < > = values in this interval not displayed.     Estimated Creatinine Clearance: 101.7 mL/min (by C-G formula based on SCr of 0.77 mg/dL).   Medical History: Past Medical History:  Diagnosis Date   Abdominal pain 10/11/2015   Aortic atherosclerosis (HCC) 09/27/2016   Formatting of this note might be different from the original. Seen on chest x-ray in 08/2016.   Bilateral lower extremity edema 06/14/2020   Chronic cough 03/08/2021   Chronic pain 11/25/2019   DDD (degenerative disc disease), lumbar 10/24/2019   Dyspnea on exertion 12/29/2019   Gallstone 10/11/2015   GERD (gastroesophageal reflux disease)    Hiatal hernia 08/12/2016   High blood cholesterol    High risk medication use 08/13/2016   History of pulmonary embolism 12/29/2019   Hyperlipemia 12/24/2016   Hypertensive crisis 05/01/2020   Iron deficiency anemia 08/12/2016   Lumbar facet  arthropathy 10/24/2019   Malaise and fatigue 08/13/2016   Morbid obesity (HCC) 11/25/2019   Obesity (BMI 30-39.9) 08/13/2016   Prediabetes 08/12/2016   Primary osteoarthritis of right knee 09/16/2019   Pulmonary emboli (HCC) 11/25/2019   Pulmonary embolism (HCC)    Pulmonary embolism and infarction (HCC) 11/25/2019   S/P arthroscopic surgery of right knee 03/13/2020   Tear of medial meniscus of right knee 08/26/2019   Vitamin B12 deficiency 08/21/2016    Assessment: 59 yof with a history of PE on Xarelto. Patient is presenting with SOB. Patient with outpatient 57 in [place] earlier on 06/05/21 positive for DVT.  Pharmacy consulted 9/7 for new start Warfarin and IV heparin bridge.   Patient was supposed to be taking xarelto; however, patient states  that she frequently takes every other day (sometimes less frequently) due to cost. Last taken 9/5 per patient.> Baseline heparin level undetectable so no need for PTT monitoring.   Imaging 06/05/21 this admission: - CT angio chest w/ Numerous bilateral pulmonary emboli, most pronounced in the lower lobes, left greater than right  06/06/21: Pharmacy consulted on 9/8 to switch from IV heparin to SQ Lovenox bridge with Warfarin for acute for  PE .  HL was therapeutic this AM on current IV heparin rate.  Renal function is within normal, CrCl ~100 m/min.  Will discontinue the heparin drip now and 1 hour later start Lovenox 105 mg SQ q12hr  (1mg /kg SQ q12hr)   Goal of Therapy:  INR goal: 2-3 Monitor platelets by anticoagulation protocol: Yes   Plan:  Convert from IV heparin to Lovenox  Discontinue IV Heparin drip now, 1 hr later give Lovenox 105 mg sq q12hr Give warfarin 7.5mg  x1 Daily INR for further dosing Monitor for signs/symptoms of bleed MD dc'd ibuprofen 2/2 potential incr risk of bleed w/ Warf. --Monitor for signs and symptoms of bleeding  **Warfarin book and video ordered 9/7**  Thank you for allowing pharmacy to be part of this patients  care team. 11/7, RPh Clinical Pharmacist [phone] 06/06/2021 9:33 AM  Please check AMION for all [facility] Pharmacy numbers After 10:00 PM, call Main Pharmacy [phone]

## 2021-06-06 NOTE — Discharge Summary (Signed)
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   Pt prescribed Xarelto but admits to only taking Xarelto every other day due to extreme cost.     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  She's been transitioned to lovenox.  Considered warfarin, but after discussion with her outpatient pharmacy, sounds like lovenox is no cost to her with her insurance.  Discussed option of d/c with warfarin/lovenox bridge with plan for long term warfarin vs lovenox long term and we decided on lovenox.    See below for additional details  Hospital Course:  Acute PEs and RLE DVTs - In the setting of non adherence to xarelto (taking every other day) She's had 2 prior VTE, both seem to have been in setting of recent surgeries She's candidate for indefinite/lifelong anticoagulation given her recurrent VTE Considered warfarin with lovenox bridge at discharge (eliquis and xarelto prohibitively expensive), but after discussing with  pharmacy, lovenox is no cost to patient - discussed option of long term warfarin vs lovenox, we decided on lovenox long term at discharge Follow with heme and PCP for additional recommendations PESI 55, class 1 CT PE protocol with numerous bilateral PE, L>R, no evidence of right heart strain Follow echocardiogram -> EF with EF 60-65%, grade 1 diastolic dysfunction, RVSF normal with normal PASP (see report) Per report, RLE with right femoral, popliteal and posterior tibial vein DVT.  Discussed [place] results with our radiologists, noted common femoral not involved, mostly distal femoral involvement - attempting to get records faxed (none faxed that I was able to see - requested from [facility] this AM) - no indication at this time for intervention without evidence phlegmasia or iliofemoral involvement.  Also, as patient did not fail anticoagulation, IVC filter not indicated.  Discussed informally with vascular who agreed. She'll need follow up with heme outpatient - follow up planned   HTN - Cont amlodipine and losartan Takes lasix prn    HLD - Cont statin Mixed Connective Tissue Disease? Per pt report, she's on plaquenil  Procedures: Echo IMPRESSIONS     1. Left ventricular ejection fraction, by estimation, is 60 to 65%. The  left ventricle has normal function. The left ventricle has no regional  wall motion abnormalities. Left ventricular diastolic parameters are  consistent with Grade I diastolic  dysfunction (impaired relaxation).   2. Right ventricular systolic function is normal. The right ventricular  size is normal. There is normal pulmonary artery systolic pressure. The  estimated right ventricular systolic pressure is 19.5 mmHg.   3. The  mitral valve is normal in structure. Mild mitral valve  regurgitation. No evidence of mitral stenosis.   4. The aortic valve has an indeterminant number of cusps. Aortic valve  regurgitation is not visualized. No aortic stenosis is present.   5.  The inferior vena cava is normal in size with greater than 50%  respiratory variability, suggesting right atrial pressure of 3 mmHg.   Consultations: none  Discharge Exam: Vitals:   06/06/21 1136 06/06/21 1603  BP: 121/62 131/74  Pulse: 100 70  Resp: 20 19  Temp: (!) 97.4 F (36.3 C) 98.6 F (37 C)  SpO2: 95% 95%   Eager for discharge  General: No acute distress. Cardiovascular: RRR Lungs: unlabored Abdomen: Soft, nontender, nondistended  Neurological: Alert and oriented 3. Moves all extremities 4 . Cranial nerves II through XII grossly intact. Skin: Warm and dry. No rashes or lesions. Extremities: No clubbing or cyanosis. No edema.   Discharge Instructions   Discharge Instructions     Call MD for:  difficulty breathing, headache or visual disturbances   Complete by: As directed    Call MD for:  extreme fatigue   Complete by: As directed    Call MD for:  hives   Complete by: As directed    Call MD for:  persistant dizziness or light-headedness   Complete by: As directed    Call MD for:  persistant nausea and vomiting   Complete by: As directed    Call MD for:  redness, tenderness, or signs of infection (pain, swelling, redness, odor or green/yellow discharge around incision site)   Complete by: As directed    Call MD for:  severe uncontrolled pain   Complete by: As directed    Call MD for:  temperature >100.4   Complete by: As directed    Diet - low sodium heart healthy   Complete by: As directed    Discharge instructions   Complete by: As directed    You were see for Meagan Doyle recurrent blood clot.  You have blood clots in your right leg and in your lungs.  This is because you were unable to take your xarelto daily as prescribed.  We'll send you home on lovenox.  When I discussed with the pharmacy, this should be no cost with your insurance.  You'll need to continue the lovenox twice daily.  This should be continued indefinitely or until the risk of anticoagulation  outweighs the benefit.    You have Devaughn Savant follow up appointment with your PCP on Monday.  You have an appointment with hematology on 06/20/21.    Return for new, recurrent, or worsening symptoms.  Please ask your PCP to request records from this hospitalization so they know what was done and what the next steps will be.   Increase activity slowly   Complete by: As directed       Allergies as of 06/06/2021   No Known Allergies      Medication List     STOP taking these medications    Xarelto 20 MG Tabs tablet Generic drug: rivaroxaban       TAKE these medications    albuterol 0.63 MG/3ML nebulizer solution Commonly known as: ACCUNEB Take 3 mLs by nebulization every 4 (four) hours as needed for wheezing or shortness of breath.   amLODipine 5 MG tablet Commonly known as: NORVASC Take 2.5 mg by mouth daily.   atorvastatin 10 MG tablet Commonly known as: LIPITOR Take 1 tablet (10 mg total) by  mouth daily.   enoxaparin 120 MG/0.8ML injection Commonly known as: LOVENOX Inject 0.7 mLs (105 mg total) into the skin every 12 (twelve) hours.   furosemide 20 MG tablet Commonly known as: LASIX Take 20 mg by mouth daily as needed for edema or fluid.   hydroxychloroquine 200 MG tablet Commonly known as: PLAQUENIL Take 400 mg by mouth daily.   losartan 50 MG tablet Commonly known as: COZAAR Take 50 mg by mouth daily. For 30 days   omeprazole 20 MG tablet Commonly known as: PRILOSEC OTC Take 20 mg by mouth daily.       No Known Allergies  Follow-up Information     Audie Pinto, FNP Follow up on 06/10/2021.   Specialty: Family Medicine Why: You have an appointment with Koren Bound on Monday, Sept 12th, at 10:30 Contact information: [address] [place] [address] [phone]         Weston Settle, MD Follow up on 06/20/2021.   Specialty: Oncology Why: You have an appointment on 06/20/2021 at 10:15 with Dr. Marcia Brash information: [address] [place] [address] [phone]                  The results of significant diagnostics from this hospitalization (including imaging, microbiology, ancillary and laboratory) are listed below for reference.    Significant Diagnostic Studies: DG Chest 2 View  Result Date: 06/04/2021 CLINICAL DATA:  Shortness of breath EXAM: CHEST - 2 VIEW COMPARISON:  03/08/2021 FINDINGS: The heart size and mediastinal contours are within normal limits. Aortic atherosclerotic calcifications. Both lungs are clear. No acute osseous abnormality. IMPRESSION: No active cardiopulmonary disease. Electronically Signed   By: Wiliam Ke M.D.   On: 06/04/2021 19:25   CT Angio Chest PE W and/or Wo Contrast  Result Date: 06/04/2021 CLINICAL DATA:  PE suspected, high prob EXAM: CT ANGIOGRAPHY CHEST WITH CONTRAST TECHNIQUE: Multidetector CT imaging of the chest was performed using the standard protocol during bolus administration of intravenous contrast. Multiplanar CT image reconstructions and MIPs were obtained to evaluate the vascular anatomy. CONTRAST:  67mL OMNIPAQUE IOHEXOL 350 MG/ML SOLN COMPARISON:  05/01/2020 FINDINGS: Cardiovascular: Bilateral pulmonary emboli are noted involving all lobes of both lungs, most pronounced in the lower lobes, left greater than right. No evidence of right heart strain (RV to LV ratio 0.83). Heart is normal size. Aortic and coronary artery calcifications. Mediastinum/Nodes: No mediastinal, hilar, or axillary adenopathy. Trachea and esophagus are unremarkable. Thyroid unremarkable. Lungs/Pleura: No confluent airspace opacities. Early ground-glass opacities peripherally in both lower lobes could reflect atelectasis or early infarcts. No effusions. Upper Abdomen: Imaging into the upper abdomen demonstrates no acute findings. Musculoskeletal: Chest wall soft tissues are unremarkable. No acute bony abnormality. Review of the MIP images confirms the above findings. IMPRESSION:  Numerous bilateral pulmonary emboli, most pronounced in the lower lobes, left greater than right. No evidence of right heart strain. Ground-glass peripheral opacities in the lower lobes could reflect atelectasis or early infarcts. Scattered coronary artery calcifications. Aortic Atherosclerosis (ICD10-I70.0). These results were called by telephone at the time of interpretation on 06/04/2021 at 10:16 pm to provider [facility] , who verbally acknowledged these results. Electronically Signed   By: Charlett Nose M.D.   On: 06/04/2021 22:19   ECHOCARDIOGRAM COMPLETE  Result Date: 06/05/2021    ECHOCARDIOGRAM REPORT   Patient Name:   Meagan Doyle Date of Exam: 06/05/2021 Medical Rec #:  [id number]  Height:       68.0 in Accession #:    [id number]         Weight:       230.0 lb Date of Birth:  04/16/1966          BSA:          2.169 m Patient Age:    55 years           BP:           115/69 mmHg Patient Gender: F                  HR:           80 bpm. Exam Location:  Inpatient Procedure: 2D Echo, Cardiac Doppler and Color Doppler Indications:    Pulmonary Embolus I26.09  History:        Patient has prior history of Echocardiogram examinations, most                 recent 11/26/2020. Risk Factors:Dyslipidemia.  Sonographer:    Eulah PontSarah Pirrotta RDCS Referring Phys: [phone] JARED M GARDNER IMPRESSIONS  1. Left ventricular ejection fraction, by estimation, is 60 to 65%. The left ventricle has normal function. The left ventricle has no regional wall motion abnormalities. Left ventricular diastolic parameters are consistent with Grade I diastolic dysfunction (impaired relaxation).  2. Right ventricular systolic function is normal. The right ventricular size is normal. There is normal pulmonary artery systolic pressure. The estimated right ventricular systolic pressure is 19.5 mmHg.  3. The mitral valve is normal in structure. Mild mitral valve regurgitation. No evidence of mitral stenosis.  4. The aortic valve has an  indeterminant number of cusps. Aortic valve regurgitation is not visualized. No aortic stenosis is present.  5. The inferior vena cava is normal in size with greater than 50% respiratory variability, suggesting right atrial pressure of 3 mmHg. FINDINGS  Left Ventricle: Left ventricular ejection fraction, by estimation, is 60 to 65%. The left ventricle has normal function. The left ventricle has no regional wall motion abnormalities. The left ventricular internal cavity size was normal in size. There is  no left ventricular hypertrophy. Left ventricular diastolic parameters are consistent with Grade I diastolic dysfunction (impaired relaxation). Normal left ventricular filling pressure. Right Ventricle: The right ventricular size is normal. No increase in right ventricular wall thickness. Right ventricular systolic function is normal. There is normal pulmonary artery systolic pressure. The tricuspid regurgitant velocity is 2.03 m/s, and  with an assumed right atrial pressure of 3 mmHg, the estimated right ventricular systolic pressure is 19.5 mmHg. Left Atrium: Left atrial size was normal in size. Right Atrium: Right atrial size was normal in size. Pericardium: There is no evidence of pericardial effusion. Mitral Valve: The mitral valve is normal in structure. Mild mitral valve regurgitation. No evidence of mitral valve stenosis. Tricuspid Valve: The tricuspid valve is normal in structure. Tricuspid valve regurgitation is trivial. No evidence of tricuspid stenosis. Aortic Valve: The aortic valve has an indeterminant number of cusps. Aortic valve regurgitation is not visualized. No aortic stenosis is present. Pulmonic Valve: The pulmonic valve was normal in structure. Pulmonic valve regurgitation is not visualized. No evidence of pulmonic stenosis. Aorta: The aortic root is normal in size and structure. Venous: The inferior vena cava is normal in size with greater than 50% respiratory variability, suggesting right  atrial pressure of 3 mmHg. IAS/Shunts: No atrial level shunt detected by color flow Doppler.  LEFT VENTRICLE PLAX 2D LVIDd:  5.30 cm  Diastology LVIDs:         3.50 cm  LV e' medial:    9.15 cm/s LV PW:         0.90 cm  LV E/e' medial:  4.8 LV IVS:        0.90 cm  LV e' lateral:   11.70 cm/s LVOT diam:     2.00 cm  LV E/e' lateral: 3.8 LV SV:         65 LV SV Index:   30 LVOT Area:     3.14 cm  RIGHT VENTRICLE RV S prime:     10.30 cm/s TAPSE (M-mode): 2.1 cm LEFT ATRIUM             Index       RIGHT ATRIUM           Index LA diam:        3.00 cm 1.38 cm/m  RA Area:     10.40 cm LA Vol (A2C):   35.3 ml 16.28 ml/m RA Volume:   18.70 ml  8.62 ml/m LA Vol (A4C):   44.5 ml 20.52 ml/m LA Biplane Vol: 43.0 ml 19.83 ml/m  AORTIC VALVE LVOT Vmax:   100.00 cm/s LVOT Vmean:  69.800 cm/s LVOT VTI:    0.207 m  AORTA Ao Root diam: 3.50 cm Ao Asc diam:  3.60 cm MITRAL VALVE               TRICUSPID VALVE MV Area (PHT): 1.93 cm    TR Peak grad:   16.5 mmHg MV Decel Time: 393 msec    TR Vmax:        203.00 cm/s MV E velocity: 44.30 cm/s MV Kang Ishida velocity: 58.80 cm/s  SHUNTS MV E/Romana Deaton ratio:  0.75        Systemic VTI:  0.21 m                            Systemic Diam: 2.00 cm Armanda Magic MD Electronically signed by Armanda Magic MD Signature Date/Time: 06/05/2021/2:43:07 PM    Final     Microbiology: Recent Results (from the past 240 hour(s))  Resp Panel by RT-PCR (Flu Dhruvi Crenshaw&B, Covid) Nasopharyngeal Swab     Status: None   Collection Time: 06/04/21  7:30 PM   Specimen: Nasopharyngeal Swab; Nasopharyngeal(NP) swabs in vial transport medium  Result Value Ref Range Status   SARS Coronavirus 2 by RT PCR NEGATIVE NEGATIVE Final    Comment: (NOTE) SARS-CoV-2 target nucleic acids are NOT DETECTED.  The SARS-CoV-2 RNA is generally detectable in upper respiratory specimens during the acute phase of infection. The lowest concentration of SARS-CoV-2 viral copies this assay can detect is 138 copies/mL. Caydyn Sprung negative result does not  preclude SARS-Cov-2 infection and should not be used as the sole basis for treatment or other patient management decisions. Sieanna Vanstone negative result may occur with  improper specimen collection/handling, submission of specimen other than nasopharyngeal swab, presence of viral mutation(s) within the areas targeted by this assay, and inadequate number of viral copies(<138 copies/mL). Verble Styron negative result must be combined with clinical observations, patient history, and epidemiological information. The expected result is Negative.  Fact Sheet for Patients:  BloggerCourse.com  Fact Sheet for Healthcare Providers:  SeriousBroker.it  This test is no t yet approved or cleared by the [place] FDA and  has been authorized for detection and/or diagnosis of SARS-CoV-2 by FDA under an Emergency  Use Authorization (EUA). This EUA will remain  in effect (meaning this test can be used) for the duration of the COVID-19 declaration under Section 564(b)(1) of the Act, 21 U.S.C.section 360bbb-3(b)(1), unless the authorization is terminated  or revoked sooner.       Influenza Klark Vanderhoef by PCR NEGATIVE NEGATIVE Final   Influenza B by PCR NEGATIVE NEGATIVE Final    Comment: (NOTE) The Xpert Xpress SARS-CoV-2/FLU/RSV plus assay is intended as an aid in the diagnosis of influenza from Nasopharyngeal swab specimens and should not be used as Jeston Junkins sole basis for treatment. Nasal washings and aspirates are unacceptable for Xpert Xpress SARS-CoV-2/FLU/RSV testing.  Fact Sheet for Patients: BloggerCourse.com  Fact Sheet for Healthcare Providers: SeriousBroker.it  This test is not yet approved or cleared by the [place] FDA and has been authorized for detection and/or diagnosis of SARS-CoV-2 by FDA under an Emergency Use Authorization (EUA). This EUA will remain in effect (meaning this test can be used) for the  duration of the COVID-19 declaration under Section 564(b)(1) of the Act, 21 U.S.C. section 360bbb-3(b)(1), unless the authorization is terminated or revoked.  Performed at [facility], [address] [place], [place] [address]      Labs: Basic Metabolic Panel: Recent Labs  Lab 06/04/21 1833 06/05/21 0805 06/06/21 0328  NA 139 139 140  K 3.6 3.5 3.7  CL 104 107 106  CO2 23 23 26   GLUCOSE 89 102* 98  BUN 11 12 13   CREATININE 0.75 0.71 0.77  CALCIUM 9.4 8.9 9.3   Liver Function Tests: Recent Labs  Lab 06/06/21 0328  AST 15  ALT 17  ALKPHOS 78  BILITOT 0.9  PROT 6.6  ALBUMIN 3.7   No results for input(s): LIPASE, AMYLASE in the last 168 hours. No results for input(s): AMMONIA in the last 168 hours. CBC: Recent Labs  Lab 06/04/21 1833 06/05/21 0805 06/06/21 0328  WBC 6.0 4.8 4.6  NEUTROABS 3.7  --  2.1  HGB 13.4 12.1 11.8*  HCT 41.6 37.9 36.8  MCV 87.9 89.4 88.7  PLT 262 218 243   Cardiac Enzymes: No results for input(s): CKTOTAL, CKMB, CKMBINDEX, TROPONINI in the last 168 hours. BNP: BNP (last 3 results) Recent Labs    06/04/21 1833  BNP 79.0    ProBNP (last 3 results) Recent Labs    03/08/21 1141  PROBNP 65.0    CBG: No results for input(s): GLUCAP in the last 168 hours.     Signed:  08/04/21 MD.  [facility] 06/06/2021, 5:38 PM

## 2021-06-06 NOTE — Evaluation (Signed)
Physical Therapy Evaluation Patient Details Name: Meagan Doyle MRN: [id number] DOB: [birth year] Today's Date: 06/06/2021   History of Present Illness  Pt is 55 yo female admitted on 06/04/21 with recurrent R LE DVT and PE due to non-compliance with Xarelto (takes every other day b/c too expensive).  Pt with hx of HTN and prior DVT/PE>  Clinical Impression  Pt admitted with above diagnosis.  She has been mobilizing in room independently. She demonstrated safe gait (400') and stairs with PT on RA with O2 sats 97%.  Pt is at baseline with stable vitals.  No need for further therapy.     Follow Up Recommendations No PT follow up    Equipment Recommendations  None recommended by PT    Recommendations for Other Services       Precautions / Restrictions Precautions Precautions: None      Mobility  Bed Mobility Overal bed mobility: Independent             General bed mobility comments: Has been mobilizing in room independently    Transfers Overall transfer level: Independent Equipment used: None Transfers: Sit to/from Stand Sit to Stand: Independent         General transfer comment: Demonstrated safely and easily  Ambulation/Gait Ambulation/Gait assistance: Independent Gait Distance (Feet): 400 Feet Assistive device: None Gait Pattern/deviations: WFL(Within Functional Limits) Gait velocity: normal   General Gait Details: Demonstrated normal gait safely and easily, had supervision but at independent level; feels at baseline; VSS  Stairs Stairs: Yes Stairs assistance: Supervision Stair Management: One rail Left;Step to pattern;Forwards Number of Stairs: 3 General stair comments: Performed safely and easily  Wheelchair Mobility    Modified Rankin (Stroke Patients Only)       Balance Overall balance assessment: Independent                                           Pertinent Vitals/Pain Pain Assessment: No/denies pain    Home  Living Family/patient expects to be discharged to:: Private residence Living Arrangements: Children (34 yo) Available Help at Discharge: Family;Available 24 hours/day Type of Home: House Home Access: Stairs to enter Entrance Stairs-Rails: Left Entrance Stairs-Number of Steps: 2 Home Layout: One level Home Equipment: Crutches      Prior Function Level of Independence: Independent         Comments: Independent with driving, IADLs, ADLs, community ambulation; no falls     Hand Dominance        Extremity/Trunk Assessment   Upper Extremity Assessment Upper Extremity Assessment: Overall WFL for tasks assessed    Lower Extremity Assessment Lower Extremity Assessment: Overall WFL for tasks assessed (ROM WFL; MMT 5/5)    Cervical / Trunk Assessment Cervical / Trunk Assessment: Normal  Communication   Communication: No difficulties  Cognition Arousal/Alertness: Awake/alert Behavior During Therapy: WFL for tasks assessed/performed Overall Cognitive Status: Within Functional Limits for tasks assessed                                        General Comments General comments (skin integrity, edema, etc.): Pt on RA with O2 sats 97% and HR 100-118 bpm    Exercises     Assessment/Plan    PT Assessment Patent does not need any further PT services  PT Problem List  PT Treatment Interventions      PT Goals (Current goals can be found in the Care Plan section)  Acute Rehab PT Goals Patient Stated Goal: return home PT Goal Formulation: All assessment and education complete, DC therapy    Frequency     Barriers to discharge        Co-evaluation               AM-PAC PT "6 Clicks" Mobility  Outcome Measure Help needed turning from your back to your side while in a flat bed without using bedrails?: None Help needed moving from lying on your back to sitting on the side of a flat bed without using bedrails?: None Help needed moving to and  from a bed to a chair (including a wheelchair)?: None Help needed standing up from a chair using your arms (e.g., wheelchair or bedside chair)?: None Help needed to walk in hospital room?: None Help needed climbing 3-5 steps with a railing? : A Little 6 Click Score: 23    End of Session   Activity Tolerance: Patient tolerated treatment well Patient left: in bed;with call bell/phone within reach (no alarm - mobilizes independently) Nurse Communication: Mobility status      Time: 1300-1313 PT Time Calculation (min) (ACUTE ONLY): 13 min   Charges:   PT Evaluation $PT Eval Low Complexity: 1 Low          Zaydrian Batta, PT Acute Rehab Services Pager [phone] [facility] [phone]   Rayetta Humphrey 06/06/2021, 1:20 PM

## 2021-06-06 NOTE — Progress Notes (Signed)
06/06/2021 Patient was educated on how to administered Lovenox shot. She was also given the Lovenox education kit, she received teaching about the kit. [facility] RN and [employer], LPN.

## 2021-06-07 ENCOUNTER — Other Ambulatory Visit (HOSPITAL_COMMUNITY): Payer: Self-pay

## 2021-06-19 NOTE — Progress Notes (Signed)
[facility]  [address] [place],  [place]  [address] [phone]  Clinic Day:  06/20/2021  Referring physician: Myrtis Ser, NP  HISTORY OF PRESENT ILLNESS:  The patient is a 55 y.o. female  who I was asked to consult upon for having recurrent pulmonary emboli.  A CT angiogram done earlier this month revealed numerous bilateral pulmonary emboli.  A Doppler ultrasound also done showed a DVT involving her right femoral, popliteal and posterior tibial veins.  The patient claims she has had 3 different episodes of blood clots.  She had a pulmonary embolus develop in 2017.  She recalls being placed on Xarelto, which she took for 6 months.  In February 2021, a CT angiogram revealed acute, bilateral pulmonary emboli.  This led to her being placed on Xarelto again for 6 months.  When recurrent pulmonary emboli and DVTs developed in her right leg earlier this month , she was placed on Lovenox BID for which she continues to take.  She denies having any type of trauma to her right leg.  She also denies having any factors which precipitated her previous pulmonary emboli.   There is no family history of blood clots.  Of note, this patient has had surgeries in the past, including arthroscopic right knee surgery, which never triggered and postoperative blood clots.   Hospital records do not show that she has had a previous hypercoagulable workup.    PAST MEDICAL HISTORY:   Past Medical History:  Diagnosis Date   Abdominal pain 10/11/2015   Aortic atherosclerosis (HCC) 09/27/2016   Formatting of this note might be different from the original. Seen on chest x-ray in 08/2016.   Bilateral lower extremity edema 06/14/2020   Chronic cough 03/08/2021   Chronic pain 11/25/2019   DDD (degenerative disc disease), lumbar 10/24/2019   Dyspnea on exertion 12/29/2019   Gallstone 10/11/2015   GERD (gastroesophageal reflux disease)    Hiatal hernia 08/12/2016   High blood cholesterol     High risk medication use 08/13/2016   History of pulmonary embolism 12/29/2019   Hyperlipemia 12/24/2016   Hypertensive crisis 05/01/2020   Iron deficiency anemia 08/12/2016   Lumbar facet arthropathy 10/24/2019   Malaise and fatigue 08/13/2016   Morbid obesity (HCC) 11/25/2019   Obesity (BMI 30-39.9) 08/13/2016   Prediabetes 08/12/2016   Primary osteoarthritis of right knee 09/16/2019   Pulmonary emboli (HCC) 11/25/2019   Pulmonary embolism (HCC)    Pulmonary embolism and infarction (HCC) 11/25/2019   S/P arthroscopic surgery of right knee 03/13/2020   Tear of medial meniscus of right knee 08/26/2019   Vitamin B12 deficiency 08/21/2016    PAST SURGICAL HISTORY:   Past Surgical History:  Procedure Laterality Date   ABDOMINAL HYSTERECTOMY     CHOLECYSTECTOMY     KNEE ARTHROPLASTY      CURRENT MEDICATIONS:   Current Outpatient Medications  Medication Sig Dispense Refill   albuterol (ACCUNEB) 0.63 MG/3ML nebulizer solution Take 3 mLs by nebulization every 4 (four) hours as needed for wheezing or shortness of breath.     amLODipine (NORVASC) 5 MG tablet Take 2.5 mg by mouth daily.     atorvastatin (LIPITOR) 10 MG tablet Take 1 tablet (10 mg total) by mouth daily. 90 tablet 3   enoxaparin (LOVENOX) 120 MG/0.8ML injection Inject 0.7 mLs (105 mg total) into the skin every 12 (twelve) hours. 42 mL 1   furosemide (LASIX) 20 MG tablet Take 20 mg by mouth daily as needed  for edema or fluid.     hydroxychloroquine (PLAQUENIL) 200 MG tablet Take 400 mg by mouth daily.     losartan (COZAAR) 50 MG tablet Take 50 mg by mouth daily. For 30 days     omeprazole (PRILOSEC OTC) 20 MG tablet Take 20 mg by mouth daily.     No current facility-administered medications for this visit.    ALLERGIES:  No Known Allergies  FAMILY HISTORY:   Family History  Problem Relation Age of Onset   Clotting disorder Neg Hx     SOCIAL HISTORY:  The patient was born and raised in [place].  She lives in  [place].  She has 2 children and 2 grandchildren. She did previous Personal assistant work.  She has also done retail work. There is no history of alcohol or tobacco abuse.    REVIEW OF SYSTEMS:  Review of Systems  Constitutional:  Negative for fatigue and fever.  HENT:   Negative for hearing loss and sore throat.   Eyes:  Negative for eye problems.  Respiratory:  Positive for shortness of breath. Negative for chest tightness, cough and hemoptysis.   Cardiovascular:  Negative for chest pain and palpitations.  Gastrointestinal:  Negative for abdominal distention, abdominal pain, blood in stool, constipation, diarrhea, nausea and vomiting.  Endocrine: Negative for hot flashes.  Genitourinary:  Negative for difficulty urinating, dysuria, frequency, hematuria and nocturia.   Musculoskeletal:  Positive for arthralgias. Negative for back pain, gait problem and myalgias.  Skin: Negative.  Negative for itching and rash.  Neurological: Negative.  Negative for dizziness, extremity weakness, gait problem, headaches, light-headedness and numbness.  Hematological: Negative.   Psychiatric/Behavioral: Negative.  Negative for depression and suicidal ideas. The patient is not nervous/anxious.     PHYSICAL EXAM:  Blood pressure (!) 145/79, pulse 91, temperature 98.1 F (36.7 C), resp. rate 16, height 5\' 8"  (1.727 m), weight 234 lb 4.8 oz (106.3 kg), SpO2 97 %. Wt Readings from Last 3 Encounters:  06/20/21 234 lb 4.8 oz (106.3 kg)  06/04/21 230 lb (104.3 kg)  04/02/21 240 lb 12.8 oz (109.2 kg)   Body mass index is 35.63 kg/m. Performance status (ECOG): 0 - Asymptomatic Physical Exam Constitutional:      Appearance: Normal appearance. She is not ill-appearing.  HENT:     Mouth/Throat:     Mouth: Mucous membranes are moist.     Pharynx: Oropharynx is clear. No oropharyngeal exudate or posterior oropharyngeal erythema.  Cardiovascular:     Rate and Rhythm: Normal rate and regular rhythm.      Heart sounds: No murmur heard.   No friction rub. No gallop.  Pulmonary:     Effort: Pulmonary effort is normal. No respiratory distress.     Breath sounds: Normal breath sounds. No wheezing, rhonchi or rales.  Abdominal:     General: Bowel sounds are normal. There is no distension.     Palpations: Abdomen is soft. There is no mass.     Tenderness: There is no abdominal tenderness.  Musculoskeletal:        General: No swelling.     Right lower leg: No edema.     Left lower leg: No edema.  Lymphadenopathy:     Cervical: No cervical adenopathy.     Upper Body:     Right upper body: No supraclavicular or axillary adenopathy.     Left upper body: No supraclavicular or axillary adenopathy.     Lower Body: No right inguinal adenopathy. No left  inguinal adenopathy.  Skin:    General: Skin is warm.     Coloration: Skin is not jaundiced.     Findings: No lesion or rash.  Neurological:     General: No focal deficit present.     Mental Status: She is alert and oriented to person, place, and time. Mental status is at baseline.     Cranial Nerves: Cranial nerves are intact.  Psychiatric:        Mood and Affect: Mood normal.        Behavior: Behavior normal.        Thought Content: Thought content normal.   LABS:   CBC Latest Ref Rng & Units 06/20/2021 06/06/2021 06/05/2021  WBC - 5.4 4.6 4.8  Hemoglobin 12.0 - 16.0 13.5 11.8(L) 12.1  Hematocrit 36 - 46 41 36.8 37.9  Platelets 150 - 399 289 243 218   CMP Latest Ref Rng & Units 06/06/2021 06/05/2021 06/04/2021  Glucose 70 - 99 mg/dL 98 882(C) 89  BUN 6 - 20 mg/dL 13 12 11   Creatinine 0.44 - 1.00 mg/dL 0.03 4.91  Sodium 135 - 145 mmol/L 140 139 139  Potassium 3.5 - 5.1 mmol/L 3.7 3.5 3.6  Chloride 98 - 111 mmol/L 106 107 104  CO2 22 - 32 mmol/L 26 23 23   Calcium 8.9 - 10.3 mg/dL 9.3 8.9 9.4  Total Protein 6.5 - 8.1 g/dL 6.6 - -  Total Bilirubin 0.3 - 1.2 mg/dL 0.9 - -  Alkaline Phos 38 - 126 U/L 78 - -  AST 15 - 41 U/L 15 - -  ALT 0 -  44 U/L 17 - -    ASSESSMENT & PLAN:  A 55 y.o. female who I was asked to consult upon for having recurrent blood clots with no apparent precipitating factors.  This patient does warrant a hypercoagulable workup for which the following clotting disorders will be checked:  Protein C deficiency, protein S deficiency, factor V Leiden mutation, prothrombin gene mutation, antithrombin deficiency and antiphospholipid syndrome.  I told the patient that irrespective of what these studies show, she will need to be kept on lifelong anticoagulation based upon her clotting history.  She knows to stay on Lovenox for now.  I will see her back in 3 weeks to go over her labs and their implications.  The patient understands all the plans discussed today and is in agreement with them.  I do appreciate Curcio, , NP for his new consult.   Meagan Doyle 58, MD

## 2021-06-20 ENCOUNTER — Inpatient Hospital Stay: Payer: Commercial Managed Care - PPO

## 2021-06-20 ENCOUNTER — Encounter: Payer: Self-pay | Admitting: Oncology

## 2021-06-20 ENCOUNTER — Telehealth: Payer: Self-pay | Admitting: Oncology

## 2021-06-20 ENCOUNTER — Other Ambulatory Visit: Payer: Self-pay | Admitting: Oncology

## 2021-06-20 ENCOUNTER — Other Ambulatory Visit: Payer: Self-pay | Admitting: Hematology and Oncology

## 2021-06-20 ENCOUNTER — Inpatient Hospital Stay: Payer: Commercial Managed Care - PPO | Attending: Oncology | Admitting: Oncology

## 2021-06-20 DIAGNOSIS — I82411 Acute embolism and thrombosis of right femoral vein: Secondary | ICD-10-CM | POA: Diagnosis not present

## 2021-06-20 DIAGNOSIS — Z7901 Long term (current) use of anticoagulants: Secondary | ICD-10-CM | POA: Diagnosis not present

## 2021-06-20 DIAGNOSIS — I82441 Acute embolism and thrombosis of right tibial vein: Secondary | ICD-10-CM | POA: Insufficient documentation

## 2021-06-20 DIAGNOSIS — Z86711 Personal history of pulmonary embolism: Secondary | ICD-10-CM

## 2021-06-20 DIAGNOSIS — I82431 Acute embolism and thrombosis of right popliteal vein: Secondary | ICD-10-CM | POA: Diagnosis not present

## 2021-06-20 DIAGNOSIS — I2699 Other pulmonary embolism without acute cor pulmonale: Secondary | ICD-10-CM | POA: Diagnosis present

## 2021-06-20 LAB — CBC
MCV: 87 (ref 81–99)
RBC: 4.73 (ref 3.87–5.11)

## 2021-06-20 LAB — CBC AND DIFFERENTIAL
HCT: 41 (ref 36–46)
Hemoglobin: 13.5 (ref 12.0–16.0)
Neutrophils Absolute: 3.67
Platelets: 289 (ref 150–399)
WBC: 5.4

## 2021-06-20 NOTE — Telephone Encounter (Signed)
Per 9/22 LOS, patient scheduled for 10/13 Follow Up.  Gave patient Appt Summary

## 2021-06-20 NOTE — Progress Notes (Unsigned)
No PA required for CPT 81241 REF#[id number]

## 2021-06-21 LAB — PROTEIN S, TOTAL AND FREE
Protein S Ag, Free: 117 % (ref 61–136)
Protein S Ag, Total: 126 % (ref 60–150)

## 2021-06-22 LAB — ANTITHROMBIN PANEL
AT III AG PPP IMM-ACNC: 90 % (ref 72–124)
Antithrombin Activity: 123 % (ref 75–135)

## 2021-06-22 LAB — LUPUS ANTICOAGULANT
DRVVT: 41.5 s (ref 0.0–47.0)
PTT Lupus Anticoagulant: 46.1 s (ref 0.0–51.9)
Thrombin Time: 22.9 s (ref 0.0–23.0)
dPT Confirm Ratio: 0.86 Ratio (ref 0.00–1.34)
dPT: 45.3 s (ref 0.0–47.6)

## 2021-06-22 LAB — CARDIOLIPIN ANTIBODIES, IGG, IGM, IGA
Anticardiolipin IgA: <9 APL U/mL (ref 0–11)
Anticardiolipin IgG: <9 GPL U/mL (ref 0–14)
Anticardiolipin IgM: <9 MPL U/mL (ref 0–12)

## 2021-06-22 LAB — BETA-2-GLYCOPROTEIN I ABS, IGG/M/A
Beta-2 Glyco I IgG: <9 GPI IgG units (ref 0–20)
Beta-2-Glycoprotein I IgA: <9 GPI IgA units (ref 0–25)
Beta-2-Glycoprotein I IgM: <9 GPI IgM units (ref 0–32)

## 2021-06-24 LAB — FACTOR 5 LEIDEN

## 2021-06-25 LAB — PROTHROMBIN GENE MUTATION

## 2021-07-11 ENCOUNTER — Inpatient Hospital Stay: Payer: Commercial Managed Care - PPO | Attending: Oncology | Admitting: Hematology and Oncology

## 2021-07-11 ENCOUNTER — Other Ambulatory Visit: Payer: Self-pay

## 2021-07-11 DIAGNOSIS — Z86711 Personal history of pulmonary embolism: Secondary | ICD-10-CM | POA: Diagnosis not present

## 2021-07-11 NOTE — Assessment & Plan Note (Addendum)
She continues with Lovenox injections. Review of lab results today reveal no evidence of existing clotting disorders. She will proceed with Lovenox injections and return to clinic in 4 weeks for evaluation.

## 2021-07-27 ENCOUNTER — Other Ambulatory Visit: Payer: Self-pay | Admitting: Cardiology

## 2021-07-29 NOTE — Telephone Encounter (Signed)
LM to return my call regarding refill instruction clarifications

## 2021-08-05 NOTE — Progress Notes (Signed)
[facility]  [address] [place],  [place]  [address] [phone]  Clinic Day:  08/12/2021  Referring physician: Audie Pinto, FNP  This document serves as a record of services personally performed by Weston Settle, MD. It was created on their behalf by Curry,Lauren E, a trained medical scribe. The creation of this record is based on the scribe's personal observations and the provider's statements to them.  HISTORY OF PRESENT ILLNESS:  The patient is a 55 y.o. female  who I recently began seeing for having recurrent pulmonary emboli, as well as a DVT.  She comes in today to go over her recent hypercoagulable work-up to determine if she has an underlying clotting disorder that triggered these blood clots.  Since her last visit, the patient has been doing fairly well.  She continues to take Lovenox twice daily to prevent additional blood clots from forming.   With respect to her blood clot history, she had a pulmonary embolus in 2017.  She recalls being placed on Xarelto, which she took for 6 months.  In February 2021, a CT angiogram revealed acute, bilateral pulmonary emboli.  This led to her being placed on Xarelto again for 6 months.  A CT angiogram done last month revealed numerous bilateral pulmonary emboli.  A Doppler ultrasound also done showed a DVT involving her right femoral, popliteal and posterior tibial veins.  She was placed on Lovenox BID for which she continues to take.   There is no family history of blood clots.  This patient has had surgeries in the past, including arthroscopic right knee surgery, which never triggered any postoperative blood clots.     PHYSICAL EXAM:  Blood pressure 137/84, pulse 74, temperature 98 F (36.7 C), resp. rate 16, height 5\' 8"  (1.727 m), weight 236 lb 6.4 oz (107.2 kg), SpO2 96 %. Wt Readings from Last 3 Encounters:  08/12/21 236 lb 6.4 oz (107.2 kg)  07/11/21 234 lb 4.8 oz (106.3 kg)  06/20/21 234 lb 4.8  oz (106.3 kg)   Body mass index is 35.94 kg/m. Performance status (ECOG): 0 - Asymptomatic Physical Exam Constitutional:      Appearance: Normal appearance. She is not ill-appearing.  HENT:     Mouth/Throat:     Mouth: Mucous membranes are moist.     Pharynx: Oropharynx is clear. No oropharyngeal exudate or posterior oropharyngeal erythema.  Cardiovascular:     Rate and Rhythm: Normal rate and regular rhythm.     Heart sounds: No murmur heard.   No friction rub. No gallop.  Pulmonary:     Effort: Pulmonary effort is normal. No respiratory distress.     Breath sounds: Normal breath sounds. No wheezing, rhonchi or rales.  Abdominal:     General: Bowel sounds are normal. There is no distension.     Palpations: Abdomen is soft. There is no mass.     Tenderness: There is no abdominal tenderness.  Musculoskeletal:        General: No swelling.     Right lower leg: No edema.     Left lower leg: No edema.  Lymphadenopathy:     Cervical: No cervical adenopathy.     Upper Body:     Right upper body: No supraclavicular or axillary adenopathy.     Left upper body: No supraclavicular or axillary adenopathy.     Lower Body: No right inguinal adenopathy. No left inguinal adenopathy.  Skin:    General: Skin is warm.  Coloration: Skin is not jaundiced.     Findings: No lesion or rash.  Neurological:     General: No focal deficit present.     Mental Status: She is alert and oriented to person, place, and time. Mental status is at baseline.  Psychiatric:        Mood and Affect: Mood normal.        Behavior: Behavior normal.        Thought Content: Thought content normal.   LABS:    Ref. Range 06/20/2021 11:20  Anticardiolipin Ab,IgA,Qn Latest Ref Range: 0 - 11 APL U/mL <9  Anticardiolipin Ab,IgG,Qn Latest Ref Range: 0 - 14 GPL U/mL <9  Anticardiolipin Ab,IgM,Qn Latest Ref Range: 0 - 12 MPL U/mL <9  PTT Lupus Anticoagulant Latest Ref Range: 0.0 - 51.9 sec 46.1  DRVVT Latest Ref  Range: 0.0 - 47.0 sec 41.5  Lupus Anticoag Interp Unknown Comment:  No lupus anticoagulant was detected.  Beta-2 Glycoprotein I Ab, IgG Latest Ref Range: 0 - 20 GPI IgG units <9  Beta-2-Glycoprotein I IgA Latest Ref Range: 0 - 25 GPI IgA units <9  Beta-2-Glycoprotein I IgM Latest Ref Range: 0 - 32 GPI IgM units <9  Antithrombin Activity Latest Ref Range: 75 - 135 % 123  AT III AG PPP IMM-ACNC Latest Ref Range: 72 - 124 % 90    Ref. Range 06/20/2021 11:16  Protein S, Free Latest Ref Range: 61 - 136 % 117  Protein S, Total Latest Ref Range: 60 - 150 % 126         ASSESSMENT & PLAN:  A 55 y.o. female who I recently again seen for recurrent blood clots.  In clinic today, I went over all of her hypercoagulable work-up, which did not reveal any type of clotting disorder that could have precipitated her previous blood clots.  Despite this, as she has had numerous, recurrent blood clots, the patient understands that she will need to stay on lifelong anticoagulation.  Clinically, she is doing well.  As she has no other pressing hematologic issues, I do feel comfortable turning her care back over to her other physicians.  I would not have a problem seeing her in the future if new hematologic or oncologic issues arise that require repeat clinical assessment.  The patient understands all the plans discussed today and is in agreement with them.  I, Foye Deer, am acting as scribe for Weston Settle, MD    I have reviewed this report as typed by the medical scribe, and it is complete and accurate.  Dequincy Kirby Funk, MD

## 2021-08-12 ENCOUNTER — Inpatient Hospital Stay: Payer: Commercial Managed Care - PPO

## 2021-08-12 ENCOUNTER — Inpatient Hospital Stay: Payer: Commercial Managed Care - PPO | Attending: Oncology | Admitting: Oncology

## 2021-08-12 ENCOUNTER — Other Ambulatory Visit: Payer: Self-pay | Admitting: Oncology

## 2021-08-12 VITALS — BP 137/84 | HR 74 | Temp 98.0°F | Resp 16 | Ht 68.0 in | Wt 236.4 lb

## 2021-08-12 DIAGNOSIS — I2699 Other pulmonary embolism without acute cor pulmonale: Secondary | ICD-10-CM

## 2021-08-14 ENCOUNTER — Other Ambulatory Visit: Payer: Self-pay | Admitting: Oncology

## 2021-08-15 ENCOUNTER — Encounter: Payer: Self-pay | Admitting: Hematology and Oncology

## 2021-08-15 NOTE — Progress Notes (Signed)
Patient Care Team: Audie Pinto, FNP as PCP - General (Family Medicine)  Clinic Day:  08/15/2021  Referring physician: Audie Pinto, FNP  ASSESSMENT & PLAN:   Assessment & Plan: History of pulmonary embolism She continues with Lovenox injections. Review of lab results today reveal no evidence of existing clotting disorders. She will proceed with Lovenox injections and return to clinic in 4 weeks for evaluation.    The patient understands the plans discussed today and is in agreement with them.  She knows to contact our office if she develops concerns prior to her next appointment.     Meagan Lux, NP  [facility] [address] [place] [address] Dept: [phone] Dept Fax: [phone]   No orders of the defined types were placed in this encounter.     CHIEF COMPLAINT:  CC: A 55 year old female with history of pulmonary embolism here for 1 week evaluation.  Current Treatment:  Lovenox  INTERVAL HISTORY:  Meagan Doyle is here today for repeat clinical assessment. She denies fevers or chills. She denies pain. Her appetite is good. Her weight has been stable.  I have reviewed the past medical history, past surgical history, social history and family history with the patient and they are unchanged from previous note.  ALLERGIES:  has No Known Allergies.  MEDICATIONS:  Current Outpatient Medications  Medication Sig Dispense Refill   albuterol (ACCUNEB) 0.63 MG/3ML nebulizer solution Take 3 mLs by nebulization every 4 (four) hours as needed for wheezing or shortness of breath.     amLODipine (NORVASC) 5 MG tablet Take 2.5 mg by mouth daily.     atorvastatin (LIPITOR) 10 MG tablet Take 1 tablet (10 mg total) by mouth daily. 90 tablet 3   enoxaparin (LOVENOX) 120 MG/0.8ML injection Inject 0.7 mLs (105 mg total) into the skin every 12 (twelve) hours. 42 mL 1   furosemide (LASIX) 20 MG tablet Take 20 mg  by mouth daily as needed for edema or fluid.     hydroxychloroquine (PLAQUENIL) 200 MG tablet Take 400 mg by mouth daily.     losartan (COZAAR) 50 MG tablet Take 50 mg by mouth daily. For 30 days     omeprazole (PRILOSEC OTC) 20 MG tablet Take 20 mg by mouth daily.     No current facility-administered medications for this visit.    HISTORY OF PRESENT ILLNESS:   Oncology History   No history exists.      REVIEW OF SYSTEMS:   Constitutional: Denies fevers, chills or abnormal weight loss Eyes: Denies blurriness of vision Ears, nose, mouth, throat, and face: Denies mucositis or sore throat Respiratory: Denies cough, dyspnea or wheezes Cardiovascular: Denies palpitation, chest discomfort or lower extremity swelling Gastrointestinal:  Denies nausea, heartburn or change in bowel habits Skin: Denies abnormal skin rashes Lymphatics: Denies new lymphadenopathy or easy bruising Neurological:Denies numbness, tingling or new weaknesses Behavioral/Psych: Mood is stable, no new changes  All other systems were reviewed with the patient and are negative.   VITALS:  Blood pressure (!) 152/84, pulse 68, temperature 97.9 F (36.6 C), resp. rate 16, height 5\' 8"  (1.727 m), weight 234 lb 4.8 oz (106.3 kg), SpO2 97 %.  Wt Readings from Last 3 Encounters:  08/12/21 236 lb 6.4 oz (107.2 kg)  07/11/21 234 lb 4.8 oz (106.3 kg)  06/20/21 234 lb 4.8 oz (106.3 kg)    Body mass index is 35.63 kg/m.  Performance status (ECOG): 1 -  Symptomatic but completely ambulatory  PHYSICAL EXAM:   GENERAL:alert, no distress and comfortable SKIN: skin color, texture, turgor are normal, no rashes or significant lesions EYES: normal, Conjunctiva are pink and non-injected, sclera clear OROPHARYNX:no exudate, no erythema and lips, buccal mucosa, and tongue normal  NECK: supple, thyroid normal size, non-tender, without nodularity LYMPH:  no palpable lymphadenopathy in the cervical, axillary or inguinal LUNGS: clear  to auscultation and percussion with normal breathing effort HEART: regular rate & rhythm and no murmurs and no lower extremity edema ABDOMEN:abdomen soft, non-tender and normal bowel sounds Musculoskeletal:no cyanosis of digits and no clubbing  NEURO: alert & oriented x 3 with fluent speech, no focal motor/sensory deficits  LABORATORY DATA:  I have reviewed the data as listed    Component Value Date/Time   NA 140 06/06/2021 0328   NA 144 07/31/2020 1709   K 3.7 06/06/2021 0328   CL 106 06/06/2021 0328   CO2 26 06/06/2021 0328   GLUCOSE 98 06/06/2021 0328   BUN 13 06/06/2021 0328   BUN 11 07/31/2020 1709   CREATININE 0.77 06/06/2021 0328   CALCIUM 9.3 06/06/2021 0328   PROT 6.6 06/06/2021 0328   ALBUMIN 3.7 06/06/2021 0328   AST 15 06/06/2021 0328   ALT 17 06/06/2021 0328   ALKPHOS 78 06/06/2021 0328   BILITOT 0.9 06/06/2021 0328   GFRNONAA >60 06/06/2021 0328   GFRAA 120 07/31/2020 1709    No results found for: SPEP, UPEP  Lab Results  Component Value Date   WBC 5.4 06/20/2021   NEUTROABS 3.67 06/20/2021   HGB 13.5 06/20/2021   HCT 41 06/20/2021   MCV 87 06/20/2021   PLT 289 06/20/2021      Chemistry      Component Value Date/Time   NA 140 06/06/2021 0328   NA 144 07/31/2020 1709   K 3.7 06/06/2021 0328   CL 106 06/06/2021 0328   CO2 26 06/06/2021 0328   BUN 13 06/06/2021 0328   BUN 11 07/31/2020 1709   CREATININE 0.77 06/06/2021 0328      Component Value Date/Time   CALCIUM 9.3 06/06/2021 0328   ALKPHOS 78 06/06/2021 0328   AST 15 06/06/2021 0328   ALT 17 06/06/2021 0328   BILITOT 0.9 06/06/2021 0328       RADIOGRAPHIC STUDIES: I have personally reviewed the radiological images as listed and agreed with the findings in the report. No results found.

## 2021-08-19 ENCOUNTER — Telehealth: Payer: Self-pay | Admitting: Oncology

## 2021-08-19 NOTE — Telephone Encounter (Signed)
Per 11/14 LOS, patient returned back to referring Provider

## 2021-09-20 ENCOUNTER — Ambulatory Visit (INDEPENDENT_AMBULATORY_CARE_PROVIDER_SITE_OTHER): Payer: Commercial Managed Care - PPO | Admitting: Cardiology

## 2021-09-20 ENCOUNTER — Encounter: Payer: Self-pay | Admitting: Cardiology

## 2021-09-20 ENCOUNTER — Other Ambulatory Visit: Payer: Self-pay

## 2021-09-20 VITALS — BP 162/98 | HR 74 | Resp 18 | Ht 68.0 in | Wt 240.6 lb

## 2021-09-20 DIAGNOSIS — E782 Mixed hyperlipidemia: Secondary | ICD-10-CM | POA: Diagnosis not present

## 2021-09-20 DIAGNOSIS — Z86718 Personal history of other venous thrombosis and embolism: Secondary | ICD-10-CM | POA: Diagnosis not present

## 2021-09-20 DIAGNOSIS — I700 Atherosclerosis of aorta: Secondary | ICD-10-CM

## 2021-09-20 DIAGNOSIS — Z86711 Personal history of pulmonary embolism: Secondary | ICD-10-CM

## 2021-09-20 NOTE — Progress Notes (Signed)
Cardiology Office Note:    Date:  09/20/2021   ID:  Meagan Doyle, DOB 1966-06-04, MRN [id number]  PCP:  Audie Pinto, FNP  Cardiologist:  Gypsy Balsam, MD    Referring MD: Audie Pinto, FNP   No chief complaint on file. I had another DVT and PE  History of Present Illness:    Meagan Doyle is a 55 y.o. female  with past medical history significant pulmonary emboli and DVT.  Initial insult was after she got cholecystectomy done then later she got the surgery and again end up having pulmonary emboli.  Also at that time pulmonary infarct.  At that time she did have right ventricular strain pattern with pulmonary pressure 44 mmHg she was transferred to [facility] for more advanced therapy however that she did not require this and she did quite well After that she did well however because of financial issues she was taking Xarelto I will only every other day and eventually end up having another episode of DVT and PE she did have going to the hospital treated appropriately and call recovering quite nicely.  She also did see hematologist for hypercoagulopathy work-up which was negative. She is in my office today.  She is doing fine she complained of having some heavy sensation in her legs like always in some pain we did investigation found to arterial tree before trying to make sure this is not peripheral vascular disease which was insignificant.  Denies have any chest pain tightness squeezing pressure burning chest.  Exertional shortness of breath is still there.  Past Medical History:  Diagnosis Date   Abdominal pain 10/11/2015   Aortic atherosclerosis (HCC) 09/27/2016   Formatting of this note might be different from the original. Seen on chest x-ray in 08/2016.   Bilateral lower extremity edema 06/14/2020   Chronic cough 03/08/2021   Chronic pain 11/25/2019   DDD (degenerative disc disease), lumbar 10/24/2019   Dyspnea on exertion 12/29/2019   Gallstone 10/11/2015   GERD  (gastroesophageal reflux disease)    Hiatal hernia 08/12/2016   High blood cholesterol    High risk medication use 08/13/2016   History of pulmonary embolism 12/29/2019   Hyperlipemia 12/24/2016   Hypertensive crisis 05/01/2020   Iron deficiency anemia 08/12/2016   Lumbar facet arthropathy 10/24/2019   Malaise and fatigue 08/13/2016   Morbid obesity (HCC) 11/25/2019   Obesity (BMI 30-39.9) 08/13/2016   Prediabetes 08/12/2016   Primary osteoarthritis of right knee 09/16/2019   Pulmonary emboli (HCC) 11/25/2019   Pulmonary embolism (HCC)    Pulmonary embolism and infarction (HCC) 11/25/2019   S/P arthroscopic surgery of right knee 03/13/2020   Tear of medial meniscus of right knee 08/26/2019   Vitamin B12 deficiency 08/21/2016    Past Surgical History:  Procedure Laterality Date   ABDOMINAL HYSTERECTOMY     CHOLECYSTECTOMY     KNEE ARTHROPLASTY      Current Medications: Current Meds  Medication Sig   albuterol (ACCUNEB) 0.63 MG/3ML nebulizer solution Take 3 mLs by nebulization every 4 (four) hours as needed for wheezing or shortness of breath.   amLODipine (NORVASC) 5 MG tablet Take 2.5 mg by mouth daily.   atorvastatin (LIPITOR) 10 MG tablet Take 1 tablet (10 mg total) by mouth daily.   furosemide (LASIX) 20 MG tablet Take 20 mg by mouth daily as needed for edema or fluid.   hydroxychloroquine (PLAQUENIL) 200 MG tablet Take 400 mg by mouth daily.   omeprazole (PRILOSEC OTC) 20  MG tablet Take 20 mg by mouth daily.     Allergies:   Patient has no known allergies.   Social History   Socioeconomic History   Marital status: Married    Spouse name: Not on file   Number of children: Not on file   Years of education: Not on file   Highest education level: Not on file  Occupational History   Not on file  Tobacco Use   Smoking status: Former    Packs/day: 1.00    Years: 15.00    Pack years: 15.00    Types: Cigarettes    Quit date: 2003    Years since quitting: 19.9   Smokeless  tobacco: Never  Vaping Use   Vaping Use: Never used  Substance and Sexual Activity   Alcohol use: Not Currently   Drug use: Never   Sexual activity: Not on file  Other Topics Concern   Not on file  Social History Narrative   Not on file   Social Determinants of Health   Financial Resource Strain: Not on file  Food Insecurity: Not on file  Transportation Needs: Not on file  Physical Activity: Not on file  Stress: Not on file  Social Connections: Not on file     Family History: The patient's family history is negative for Clotting disorder. ROS:   Please see the history of present illness.    All 14 point review of systems negative except as described per history of present illness  EKGs/Labs/Other Studies Reviewed:      Recent Labs: 03/08/2021: Pro B Natriuretic peptide (BNP) 65.0 06/04/2021: B Natriuretic Peptide 79.0 06/06/2021: ALT 17; BUN 13; Creatinine, Ser 0.77; Potassium 3.7; Sodium 140 06/20/2021: Hemoglobin 13.5; Platelets 289  Recent Lipid Panel    Component Value Date/Time   CHOL 182 01/14/2021 1559   TRIG 107 01/14/2021 1559   HDL 62 01/14/2021 1559   CHOLHDL 2.9 01/14/2021 1559   CHOLHDL 3.6 11/27/2019 0216   VLDL 18 11/27/2019 0216   LDLCALC 101 (H) 01/14/2021 1559    Physical Exam:    VS:  BP (!) 162/98 (BP Location: Right Arm, Patient Position: Sitting, Cuff Size: Normal)    Pulse 74    Resp 18    Ht 5\' 8"  (1.727 m)    Wt 240 lb 9.6 oz (109.1 kg)    BMI 36.58 kg/m     Wt Readings from Last 3 Encounters:  09/20/21 240 lb 9.6 oz (109.1 kg)  08/12/21 236 lb 6.4 oz (107.2 kg)  07/11/21 234 lb 4.8 oz (106.3 kg)     GEN:  Well nourished, well developed in no acute distress HEENT: Normal NECK: No JVD; No carotid bruits LYMPHATICS: No lymphadenopathy CARDIAC: RRR, no murmurs, no rubs, no gallops RESPIRATORY:  Clear to auscultation without rales, wheezing or rhonchi  ABDOMEN: Soft, non-tender, non-distended MUSCULOSKELETAL:  No edema; No deformity   SKIN: Warm and dry LOWER EXTREMITIES: no swelling NEUROLOGIC:  Alert and oriented x 3 PSYCHIATRIC:  Normal affect   ASSESSMENT:    1. Mixed hyperlipidemia   2. Aortic atherosclerosis (HCC)   3. History of deep vein thrombosis (DVT) of lower extremity   4. History of pulmonary embolism    PLAN:    In order of problems listed above:  History of DVT and PE x3 quite incredible story.  Now she is on Lovenox every 12 hours which appears to be cheaper than newer anticoagulant agents which is kind of incredible to me but apparently  insurance company pay very well for Lovenox does not pay much for Eliquis and Xarelto.  So we will continue.  She did see hematologist.  No hypercoagulopathy identified.  Continue present management.  From my cardiac point of view I will repeat her ultrasounds of her heart within the next couple months to make sure there is no right ventricle enlargement. Aortic atherosclerosis.  She is already on atorvastatin 10 mg which I will continue I did review her K PN which show me LDL of 101 HDL 62 however this is from a problem like arrangements for another fasting lipid profile to be done. Essential hypertension blood pressure elevated today.  I will ask her to have Chem-7 done today if Chem-7 is fine we will double the dose of losartan and later we will recheck kidney function again.   Medication Adjustments/Labs and Tests Ordered: Current medicines are reviewed at length with the patient today.  Concerns regarding medicines are outlined above.  No orders of the defined types were placed in this encounter.  Medication changes: No orders of the defined types were placed in this encounter.   Signed, Georgeanna Lea, MD, [facility] 09/20/2021 2:28 PM    [facility]

## 2021-09-20 NOTE — Addendum Note (Signed)
Addended by: Eleonore Chiquito on: 09/20/2021 02:39 PM   Modules accepted: Orders

## 2021-09-20 NOTE — Patient Instructions (Signed)
Medication Instructions:  Your physician recommends that you continue on your current medications as directed. Please refer to the Current Medication list given to you today.  *If you need a refill on your cardiac medications before your next appointment, please call your pharmacy*   Lab Work: Your physician recommends that you have a BMET and direct LDL today in the office.  If you have labs (blood work) drawn today and your tests are completely normal, you will receive your results only by: MyChart Message (if you have MyChart) OR A paper copy in the mail If you have any lab test that is abnormal or we need to change your treatment, we will call you to review the results.   Testing/Procedures: Your physician has requested that you have an echocardiogram. Echocardiography is a painless test that uses sound waves to create images of your heart. It provides your doctor with information about the size and shape of your heart and how well your hearts chambers and valves are working. This procedure takes approximately one hour. There are no restrictions for this procedure.    Follow-Up: At [facility], you and your health needs are our priority.  As part of our continuing mission to provide you with exceptional heart care, we have created designated Provider Care Teams.  These Care Teams include your primary Cardiologist (physician) and Advanced Practice Providers (APPs -  Physician Assistants and Nurse Practitioners) who all work together to provide you with the care you need, when you need it.  We recommend signing up for the patient portal called "MyChart".  Sign up information is provided on this After Visit Summary.  MyChart is used to connect with patients for Virtual Visits (Telemedicine).  Patients are able to view lab/test results, encounter notes, upcoming appointments, etc.  Non-urgent messages can be sent to your provider as well.   To learn more about what you can do with  MyChart, go to [email or web address].    Your next appointment:   6 month(s)  The format for your next appointment:   In Person  Provider:   Gypsy Balsam, MD   Other Instructions Echocardiogram An echocardiogram is a test that uses sound waves (ultrasound) to produce images of the heart. Images from an echocardiogram can provide important information about: Heart size and shape. The size and thickness and movement of your heart's walls. Heart muscle function and strength. Heart valve function or if you have stenosis. Stenosis is when the heart valves are too narrow. If blood is flowing backward through the heart valves (regurgitation). A tumor or infectious growth around the heart valves. Areas of heart muscle that are not working well because of poor blood flow or injury from a heart attack. Aneurysm detection. An aneurysm is a weak or damaged part of an artery wall. The wall bulges out from the normal force of blood pumping through the body. Tell a health care provider about: Any allergies you have. All medicines you are taking, including vitamins, herbs, eye drops, creams, and over-the-counter medicines. Any blood disorders you have. Any surgeries you have had. Any medical conditions you have. Whether you are pregnant or may be pregnant. What are the risks? Generally, this is a safe test. However, problems may occur, including an allergic reaction to dye (contrast) that may be used during the test. What happens before the test? No specific preparation is needed. You may eat and drink normally. What happens during the test? You will take off your clothes from the waist up  and put on a hospital gown. Electrodes or electrocardiogram (ECG)patches may be placed on your chest. The electrodes or patches are then connected to a device that monitors your heart rate and rhythm. You will lie down on a table for an ultrasound exam. A gel will be applied to your chest to help sound  waves pass through your skin. A handheld device, called a transducer, will be pressed against your chest and moved over your heart. The transducer produces sound waves that travel to your heart and bounce back (or "echo" back) to the transducer. These sound waves will be captured in real-time and changed into images of your heart that can be viewed on a video monitor. The images will be recorded on a computer and reviewed by your health care provider. You may be asked to change positions or hold your breath for a short time. This makes it easier to get different views or better views of your heart. In some cases, you may receive contrast through an IV in one of your veins. This can improve the quality of the pictures from your heart. The procedure may vary among health care providers and hospitals.   What can I expect after the test? You may return to your normal, everyday life, including diet, activities, and medicines, unless your health care provider tells you not to do that. Follow these instructions at home: It is up to you to get the results of your test. Ask your health care provider, or the department that is doing the test, when your results will be ready. Keep all follow-up visits. This is important. Summary An echocardiogram is a test that uses sound waves (ultrasound) to produce images of the heart. Images from an echocardiogram can provide important information about the size and shape of your heart, heart muscle function, heart valve function, and other possible heart problems. You do not need to do anything to prepare before this test. You may eat and drink normally. After the echocardiogram is completed, you may return to your normal, everyday life, unless your health care provider tells you not to do that. This information is not intended to replace advice given to you by your health care provider. Make sure you discuss any questions you have with your health care provider. Document  Revised: 05/08/2020 Document Reviewed: 05/08/2020 Elsevier Patient Education  2021 [employer].

## 2021-09-21 LAB — BASIC METABOLIC PANEL
BUN/Creatinine Ratio: 22 (ref 9–23)
BUN: 18 mg/dL (ref 6–24)
CO2: 23 mmol/L (ref 20–29)
Calcium: 9.8 mg/dL (ref 8.7–10.2)
Chloride: 104 mmol/L (ref 96–106)
Creatinine, Ser: 0.83 mg/dL (ref 0.57–1.00)
Glucose: 86 mg/dL (ref 70–99)
Potassium: 4.6 mmol/L (ref 3.5–5.2)
Sodium: 142 mmol/L (ref 134–144)
eGFR: 84 mL/min/{1.73_m2} (ref >59)

## 2021-09-21 LAB — LDL CHOLESTEROL, DIRECT: LDL Direct: 113 mg/dL — ABNORMAL HIGH (ref 0–99)

## 2021-09-25 MED ORDER — ATORVASTATIN CALCIUM 20 MG PO TABS: 20.0000 mg | ORAL_TABLET | Freq: Every day | ORAL | 3 refills | Status: DC

## 2021-09-25 NOTE — Addendum Note (Signed)
Addended by: Eleonore Chiquito on: 09/25/2021 02:45 PM   Modules accepted: Orders

## 2021-09-26 ENCOUNTER — Telehealth: Payer: Self-pay | Admitting: Cardiology

## 2021-09-26 NOTE — Telephone Encounter (Signed)
Left VM for pt to call back.

## 2021-09-26 NOTE — Telephone Encounter (Signed)
Results reviewed with pt as per Dr. Krasowski's note.  Pt verbalized understanding and had no additional questions. Routed to PCP  

## 2021-09-26 NOTE — Telephone Encounter (Signed)
Pt is returning call regarding recent lab results

## 2021-11-15 LAB — LIPID PANEL
Chol/HDL Ratio: 3.2 ratio (ref 0.0–4.4)
Cholesterol, Total: 159 mg/dL (ref 100–199)
HDL: 49 mg/dL (ref >39)
LDL Chol Calc (NIH): 78 mg/dL (ref 0–99)
Triglycerides: 193 mg/dL — ABNORMAL HIGH (ref 0–149)
VLDL Cholesterol Cal: 32 mg/dL (ref 5–40)

## 2021-11-15 LAB — ALT: ALT: 16 IU/L (ref 0–32)

## 2021-11-15 LAB — AST: AST: 15 IU/L (ref 0–40)

## 2021-11-20 ENCOUNTER — Telehealth: Payer: Self-pay | Admitting: Cardiology

## 2021-11-20 NOTE — Telephone Encounter (Signed)
Returned patient call and left a message  

## 2021-11-20 NOTE — Telephone Encounter (Signed)
Patient returning call for lab results. 

## 2021-11-21 NOTE — Telephone Encounter (Signed)
Pt is returning call.  

## 2021-11-22 ENCOUNTER — Telehealth: Payer: Self-pay

## 2021-11-22 DIAGNOSIS — I700 Atherosclerosis of aorta: Secondary | ICD-10-CM

## 2021-11-22 DIAGNOSIS — E782 Mixed hyperlipidemia: Secondary | ICD-10-CM

## 2021-11-22 MED ORDER — ATORVASTATIN CALCIUM 40 MG PO TABS: 40.0000 mg | ORAL_TABLET | Freq: Every day | ORAL | 1 refills | Status: DC

## 2021-11-22 NOTE — Telephone Encounter (Signed)
Patient notified of the following per Dr. Bing Matter. She will double up on the 20mg  daily and is aware new script for 40mg  was sent. Lab order on file, notified to fast for blood work.

## 2021-11-22 NOTE — Telephone Encounter (Signed)
-----   Message from Georgeanna Lea, MD sent at 11/19/2021  3:34 PM EST ----- Yes, lets go to 40 mg of Lipitor ----- Message ----- From: Heywood Bene, CMA Sent: 11/19/2021  11:25 AM EST To: Georgeanna Lea, MD  It look like he is already on Lipitor 20mg . Do you want to increase this? ----- Message ----- From: , MD Sent: 11/19/2021  10:43 AM EST To: 11/21/2021, RN  Cholesterol much better but I suggest increase Lipitor to 20 mg daily and checking fasting lipid profile AST ALT in 6 weeks

## 2021-12-17 ENCOUNTER — Other Ambulatory Visit: Payer: Self-pay | Admitting: Cardiology

## 2022-01-11 ENCOUNTER — Other Ambulatory Visit: Payer: Self-pay | Admitting: Cardiology

## 2022-03-13 ENCOUNTER — Ambulatory Visit (INDEPENDENT_AMBULATORY_CARE_PROVIDER_SITE_OTHER): Payer: Commercial Managed Care - PPO

## 2022-03-13 DIAGNOSIS — I700 Atherosclerosis of aorta: Secondary | ICD-10-CM

## 2022-03-13 LAB — ECHOCARDIOGRAM COMPLETE
Area-P 1/2: 3.02 cm2
Calc EF: 52.7 %
S' Lateral: 3.9 cm
Single Plane A2C EF: 50.8 %
Single Plane A4C EF: 53.1 %

## 2022-03-13 MED ORDER — PERFLUTREN LIPID MICROSPHERE
1.0000 mL | INTRAVENOUS | Status: AC | PRN
  Administered 2022-03-13: 5 mL via INTRAVENOUS

## 2022-03-14 LAB — LIPID PANEL
Chol/HDL Ratio: 2.7 ratio (ref 0.0–4.4)
Cholesterol, Total: 157 mg/dL (ref 100–199)
HDL: 58 mg/dL (ref >39)
LDL Chol Calc (NIH): 80 mg/dL (ref 0–99)
Triglycerides: 104 mg/dL (ref 0–149)
VLDL Cholesterol Cal: 19 mg/dL (ref 5–40)

## 2022-03-14 LAB — ALT: ALT: 18 IU/L (ref 0–32)

## 2022-03-14 LAB — AST: AST: 15 IU/L (ref 0–40)

## 2022-03-21 ENCOUNTER — Encounter: Payer: Self-pay | Admitting: Cardiology

## 2022-03-21 ENCOUNTER — Ambulatory Visit (INDEPENDENT_AMBULATORY_CARE_PROVIDER_SITE_OTHER): Payer: Commercial Managed Care - PPO | Admitting: Cardiology

## 2022-03-21 VITALS — BP 132/82 | HR 60 | Ht 69.0 in | Wt 250.6 lb

## 2022-03-21 DIAGNOSIS — E782 Mixed hyperlipidemia: Secondary | ICD-10-CM

## 2022-03-21 DIAGNOSIS — I10 Essential (primary) hypertension: Secondary | ICD-10-CM

## 2022-03-21 DIAGNOSIS — Z86718 Personal history of other venous thrombosis and embolism: Secondary | ICD-10-CM | POA: Diagnosis not present

## 2022-03-21 DIAGNOSIS — R600 Localized edema: Secondary | ICD-10-CM

## 2022-03-21 DIAGNOSIS — Z86711 Personal history of pulmonary embolism: Secondary | ICD-10-CM | POA: Diagnosis not present

## 2022-03-21 DIAGNOSIS — R7303 Prediabetes: Secondary | ICD-10-CM

## 2022-03-21 DIAGNOSIS — R0609 Other forms of dyspnea: Secondary | ICD-10-CM

## 2022-03-21 MED ORDER — FUROSEMIDE 40 MG PO TABS: 40.0000 mg | ORAL_TABLET | Freq: Every day | ORAL | 3 refills | Status: DC

## 2022-03-21 MED ORDER — POTASSIUM CHLORIDE ER 10 MEQ PO TBCR: 10.0000 meq | EXTENDED_RELEASE_TABLET | Freq: Every day | ORAL | 3 refills | Status: DC

## 2022-03-26 LAB — BASIC METABOLIC PANEL
BUN/Creatinine Ratio: 15 (ref 9–23)
BUN: 10 mg/dL (ref 6–24)
CO2: 23 mmol/L (ref 20–29)
Calcium: 9.1 mg/dL (ref 8.7–10.2)
Chloride: 105 mmol/L (ref 96–106)
Creatinine, Ser: 0.66 mg/dL (ref 0.57–1.00)
Glucose: 80 mg/dL (ref 70–99)
Potassium: 3.8 mmol/L (ref 3.5–5.2)
Sodium: 143 mmol/L (ref 134–144)
eGFR: 104 mL/min/{1.73_m2} (ref >59)

## 2022-03-26 LAB — PRO B NATRIURETIC PEPTIDE: NT-Pro BNP: 55 pg/mL (ref 0–287)

## 2022-03-27 ENCOUNTER — Telehealth: Payer: Self-pay

## 2022-03-27 NOTE — Telephone Encounter (Signed)
-----   Message from Georgeanna Lea, MD sent at 03/26/2022 12:36 PM EDT ----- Blood test did not show any evidence of CHF, Chem-7 also looks perfectly normal, Continue management that we discussed during the last visit

## 2022-03-27 NOTE — Telephone Encounter (Signed)
Patient notified of results.

## 2022-07-13 ENCOUNTER — Other Ambulatory Visit: Payer: Self-pay | Admitting: Cardiology

## 2022-07-14 NOTE — Telephone Encounter (Signed)
Atorvastatin refills to pharmacy 

## 2022-07-21 ENCOUNTER — Ambulatory Visit: Payer: Commercial Managed Care - PPO | Admitting: Cardiology

## 2022-09-05 ENCOUNTER — Encounter: Payer: Self-pay | Admitting: Cardiology

## 2022-09-05 ENCOUNTER — Ambulatory Visit: Payer: Commercial Managed Care - PPO | Attending: Cardiology | Admitting: Cardiology

## 2022-09-05 VITALS — BP 140/90 | HR 95 | Ht 68.0 in | Wt 251.4 lb

## 2022-09-05 DIAGNOSIS — R7303 Prediabetes: Secondary | ICD-10-CM | POA: Diagnosis not present

## 2022-09-05 DIAGNOSIS — R0600 Dyspnea, unspecified: Secondary | ICD-10-CM

## 2022-09-05 DIAGNOSIS — Z86711 Personal history of pulmonary embolism: Secondary | ICD-10-CM

## 2022-09-05 DIAGNOSIS — R0609 Other forms of dyspnea: Secondary | ICD-10-CM

## 2022-09-05 DIAGNOSIS — Z86718 Personal history of other venous thrombosis and embolism: Secondary | ICD-10-CM

## 2022-09-05 DIAGNOSIS — R600 Localized edema: Secondary | ICD-10-CM

## 2022-09-05 NOTE — Progress Notes (Unsigned)
Cardiology Office Note:    Date:  09/05/2022   ID:  Meagan Doyle, DOB June 15, 1966, MRN [id number]  PCP:  Audie Pinto, FNP  Cardiologist:  Gypsy Balsam, MD    Referring MD: Audie Pinto, FNP   Chief Complaint  Patient presents with   Follow-up    History of Present Illness:    Meagan Doyle is a 56 y.o. female    with past medical history significant pulmonary emboli and DVT.  Initial insult was after she got cholecystectomy done then later she got the surgery and again end up having pulmonary emboli.  Also at that time pulmonary infarct.  At that time she did have right ventricular strain pattern with pulmonary pressure 44 mmHg she was transferred to [facility] for more advanced therapy however that she did not require this and she did quite well After that she did well however because of financial issues she was taking Xarelto I will only every other day and eventually end up having another episode of DVT and PE she did have going to the hospital treated appropriately and call recovering quite nicely.  She also did see hematologist for hypercoagulopathy work-up which was negative. She comes today to my office for follow-up.  Overall doing well.  She did have urine tract infection couple weeks ago she decided to stop temporarily her diuretics now she is back on it.  Doing well does complain of having some heaviness in her legs.  Some swelling but otherwise seems to be holding quite well  Past Medical History:  Diagnosis Date   Abdominal pain 10/11/2015   Aortic atherosclerosis (HCC) 09/27/2016   Formatting of this note might be different from the original. Seen on chest x-ray in 08/2016.   Bilateral lower extremity edema 06/14/2020   Chronic cough 03/08/2021   Chronic pain 11/25/2019   DDD (degenerative disc disease), lumbar 10/24/2019   Dyspnea on exertion 12/29/2019   Gallstone 10/11/2015   GERD (gastroesophageal reflux disease)    Hiatal hernia 08/12/2016   High blood  cholesterol    High risk medication use 08/13/2016   History of pulmonary embolism 12/29/2019   Hyperlipemia 12/24/2016   Hypertensive crisis 05/01/2020   Iron deficiency anemia 08/12/2016   Lumbar facet arthropathy 10/24/2019   Malaise and fatigue 08/13/2016   Morbid obesity (HCC) 11/25/2019   Obesity (BMI 30-39.9) 08/13/2016   Prediabetes 08/12/2016   Primary osteoarthritis of right knee 09/16/2019   Pulmonary emboli (HCC) 11/25/2019   Pulmonary embolism (HCC)    Pulmonary embolism and infarction (HCC) 11/25/2019   S/P arthroscopic surgery of right knee 03/13/2020   Tear of medial meniscus of right knee 08/26/2019   Vitamin B12 deficiency 08/21/2016    Past Surgical History:  Procedure Laterality Date   ABDOMINAL HYSTERECTOMY     CHOLECYSTECTOMY     KNEE ARTHROPLASTY      Current Medications: Current Meds  Medication Sig   albuterol (ACCUNEB) 0.63 MG/3ML nebulizer solution Take 3 mLs by nebulization every 4 (four) hours as needed for wheezing or shortness of breath.   amLODipine (NORVASC) 5 MG tablet Take 2.5 mg by mouth daily.   atorvastatin (LIPITOR) 40 MG tablet TAKE 1 TABLET BY MOUTH EVERY DAY   enoxaparin (LOVENOX) 120 MG/0.8ML injection Inject 0.7 mLs (105 mg total) into the skin every 12 (twelve) hours.   furosemide (LASIX) 40 MG tablet Take 1 tablet (40 mg total) by mouth daily.   hydroxychloroquine (PLAQUENIL) 200 MG tablet Take 400 mg  by mouth daily.   losartan (COZAAR) 50 MG tablet Take 50 mg by mouth daily. For 30 days   omeprazole (PRILOSEC OTC) 20 MG tablet Take 20 mg by mouth daily.   potassium chloride (KLOR-CON) 10 MEQ tablet Take 1 tablet (10 mEq total) by mouth daily.     Allergies:   Patient has no known allergies.   Social History   Socioeconomic History   Marital status: Married    Spouse name: Not on file   Number of children: Not on file   Years of education: Not on file   Highest education level: Not on file  Occupational History   Not on file   Tobacco Use   Smoking status: Former    Packs/day: 1.00    Years: 15.00    Total pack years: 15.00    Types: Cigarettes    Quit date: 2003    Years since quitting: 20.9   Smokeless tobacco: Never  Vaping Use   Vaping Use: Never used  Substance and Sexual Activity   Alcohol use: Not Currently   Drug use: Never   Sexual activity: Not on file  Other Topics Concern   Not on file  Social History Narrative   Not on file   Social Determinants of Health   Financial Resource Strain: Not on file  Food Insecurity: Not on file  Transportation Needs: Not on file  Physical Activity: Not on file  Stress: Not on file  Social Connections: Not on file     Family History: The patient's family history is negative for Clotting disorder. ROS:   Please see the history of present illness.    All 14 point review of systems negative except as described per history of present illness  EKGs/Labs/Other Studies Reviewed:      Recent Labs: 03/13/2022: ALT 18 03/25/2022: BUN 10; Creatinine, Ser 0.66; NT-Pro BNP 55; Potassium 3.8; Sodium 143  Recent Lipid Panel    Component Value Date/Time   CHOL 157 03/13/2022 1337   TRIG 104 03/13/2022 1337   HDL 58 03/13/2022 1337   CHOLHDL 2.7 03/13/2022 1337   CHOLHDL 3.6 11/27/2019 0216   VLDL 18 11/27/2019 0216   LDLCALC 80 03/13/2022 1337   LDLDIRECT 113 (H) 09/20/2021 1444    Physical Exam:    VS:  BP (!) 140/90 (BP Location: Left Arm, Patient Position: Sitting)   Pulse 95   Ht 5\' 8"  (1.727 m)   Wt 251 lb 6.4 oz (114 kg)   SpO2 95%   BMI 38.23 kg/m     Wt Readings from Last 3 Encounters:  09/05/22 251 lb 6.4 oz (114 kg)  03/21/22 250 lb 9.6 oz (113.7 kg)  09/20/21 240 lb 9.6 oz (109.1 kg)     GEN:  Well nourished, well developed in no acute distress HEENT: Normal NECK: No JVD; No carotid bruits LYMPHATICS: No lymphadenopathy CARDIAC: RRR, no murmurs, no rubs, no gallops RESPIRATORY:  Clear to auscultation without rales, wheezing  or rhonchi  ABDOMEN: Soft, non-tender, non-distended MUSCULOSKELETAL:  No edema; No deformity  SKIN: Warm and dry LOWER EXTREMITIES: no swelling NEUROLOGIC:  Alert and oriented x 3 PSYCHIATRIC:  Normal affect   ASSESSMENT:    1. History of deep vein thrombosis (DVT) of lower extremity   2. History of pulmonary embolism   3. Prediabetes   4. Bilateral lower extremity edema   5. Dyspnea on exertion    PLAN:    In order of problems listed above:  Essential hypertension:  Uncontrolled.  Check Chem-7 if Chem-7 is fine we will double the dose of losartan History of DVT as well as PE.  She is anticoagulated with Lovenox which I will continue. Dyspnea exertion multifactorial.  I will refer her to pulmonary to assess her pulmonary status.  She proximal probably will require CT as well as pulmonary function test. Prediabetes that being followed by internal medicine team.   Medication Adjustments/Labs and Tests Ordered: Current medicines are reviewed at length with the patient today.  Concerns regarding medicines are outlined above.  No orders of the defined types were placed in this encounter.  Medication changes: No orders of the defined types were placed in this encounter.   Signed, Georgeanna Lea, MD, [facility] 09/05/2022 1:37 PM    [facility]

## 2022-09-05 NOTE — Patient Instructions (Signed)
Medication Instructions:  Your physician recommends that you continue on your current medications as directed. Please refer to the Current Medication list given to you today.  *If you need a refill on your cardiac medications before your next appointment, please call your pharmacy*   Lab Work: BMP- Today If you have labs (blood work) drawn today and your tests are completely normal, you will receive your results only by: MyChart Message (if you have MyChart) OR A paper copy in the mail If you have any lab test that is abnormal or we need to change your treatment, we will call you to review the results.   Testing/Procedures: None Ordered   Follow-Up: At [facility], you and your health needs are our priority.  As part of our continuing mission to provide you with exceptional heart care, we have created designated Provider Care Teams.  These Care Teams include your primary Cardiologist (physician) and Advanced Practice Providers (APPs -  Physician Assistants and Nurse Practitioners) who all work together to provide you with the care you need, when you need it.  We recommend signing up for the patient portal called "MyChart".  Sign up information is provided on this After Visit Summary.  MyChart is used to connect with patients for Virtual Visits (Telemedicine).  Patients are able to view lab/test results, encounter notes, upcoming appointments, etc.  Non-urgent messages can be sent to your provider as well.   To learn more about what you can do with MyChart, go to [email or web address].    Your next appointment:   6 month(s)  The format for your next appointment:   In Person  Provider:   Gypsy Balsam, MD    Other Instructions Referral to Pulmonology- they will call for appt

## 2022-09-06 LAB — BASIC METABOLIC PANEL
BUN/Creatinine Ratio: 10 (ref 9–23)
BUN: 8 mg/dL (ref 6–24)
CO2: 23 mmol/L (ref 20–29)
Calcium: 9 mg/dL (ref 8.7–10.2)
Chloride: 105 mmol/L (ref 96–106)
Creatinine, Ser: 0.77 mg/dL (ref 0.57–1.00)
Glucose: 106 mg/dL — ABNORMAL HIGH (ref 70–99)
Potassium: 3.7 mmol/L (ref 3.5–5.2)
Sodium: 143 mmol/L (ref 134–144)
eGFR: 91 mL/min/{1.73_m2} (ref >59)

## 2022-09-17 ENCOUNTER — Telehealth: Payer: Self-pay

## 2022-09-17 DIAGNOSIS — I10 Essential (primary) hypertension: Secondary | ICD-10-CM

## 2022-09-17 NOTE — Telephone Encounter (Signed)
BMP entered for follow up

## 2022-09-29 DIAGNOSIS — I5189 Other ill-defined heart diseases: Secondary | ICD-10-CM

## 2022-09-29 DIAGNOSIS — I2510 Atherosclerotic heart disease of native coronary artery without angina pectoris: Secondary | ICD-10-CM

## 2022-09-29 DIAGNOSIS — Q2112 Patent foramen ovale: Secondary | ICD-10-CM | POA: Insufficient documentation

## 2022-09-29 HISTORY — DX: Other ill-defined heart diseases: I51.89

## 2022-09-29 HISTORY — DX: Patent foramen ovale: Q21.12

## 2022-09-29 HISTORY — DX: Atherosclerotic heart disease of native coronary artery without angina pectoris: I25.10

## 2022-10-20 ENCOUNTER — Ambulatory Visit: Payer: Commercial Managed Care - PPO | Admitting: Pulmonary Disease

## 2022-12-21 IMAGING — DX DG CHEST 2V
2 series · 2 of 2 positions shown · non-contrast
Comparison: 06/15/2020.

CLINICAL DATA: Dyspnea on exertion

EXAM:
CHEST - 2 VIEW

[chest pa]
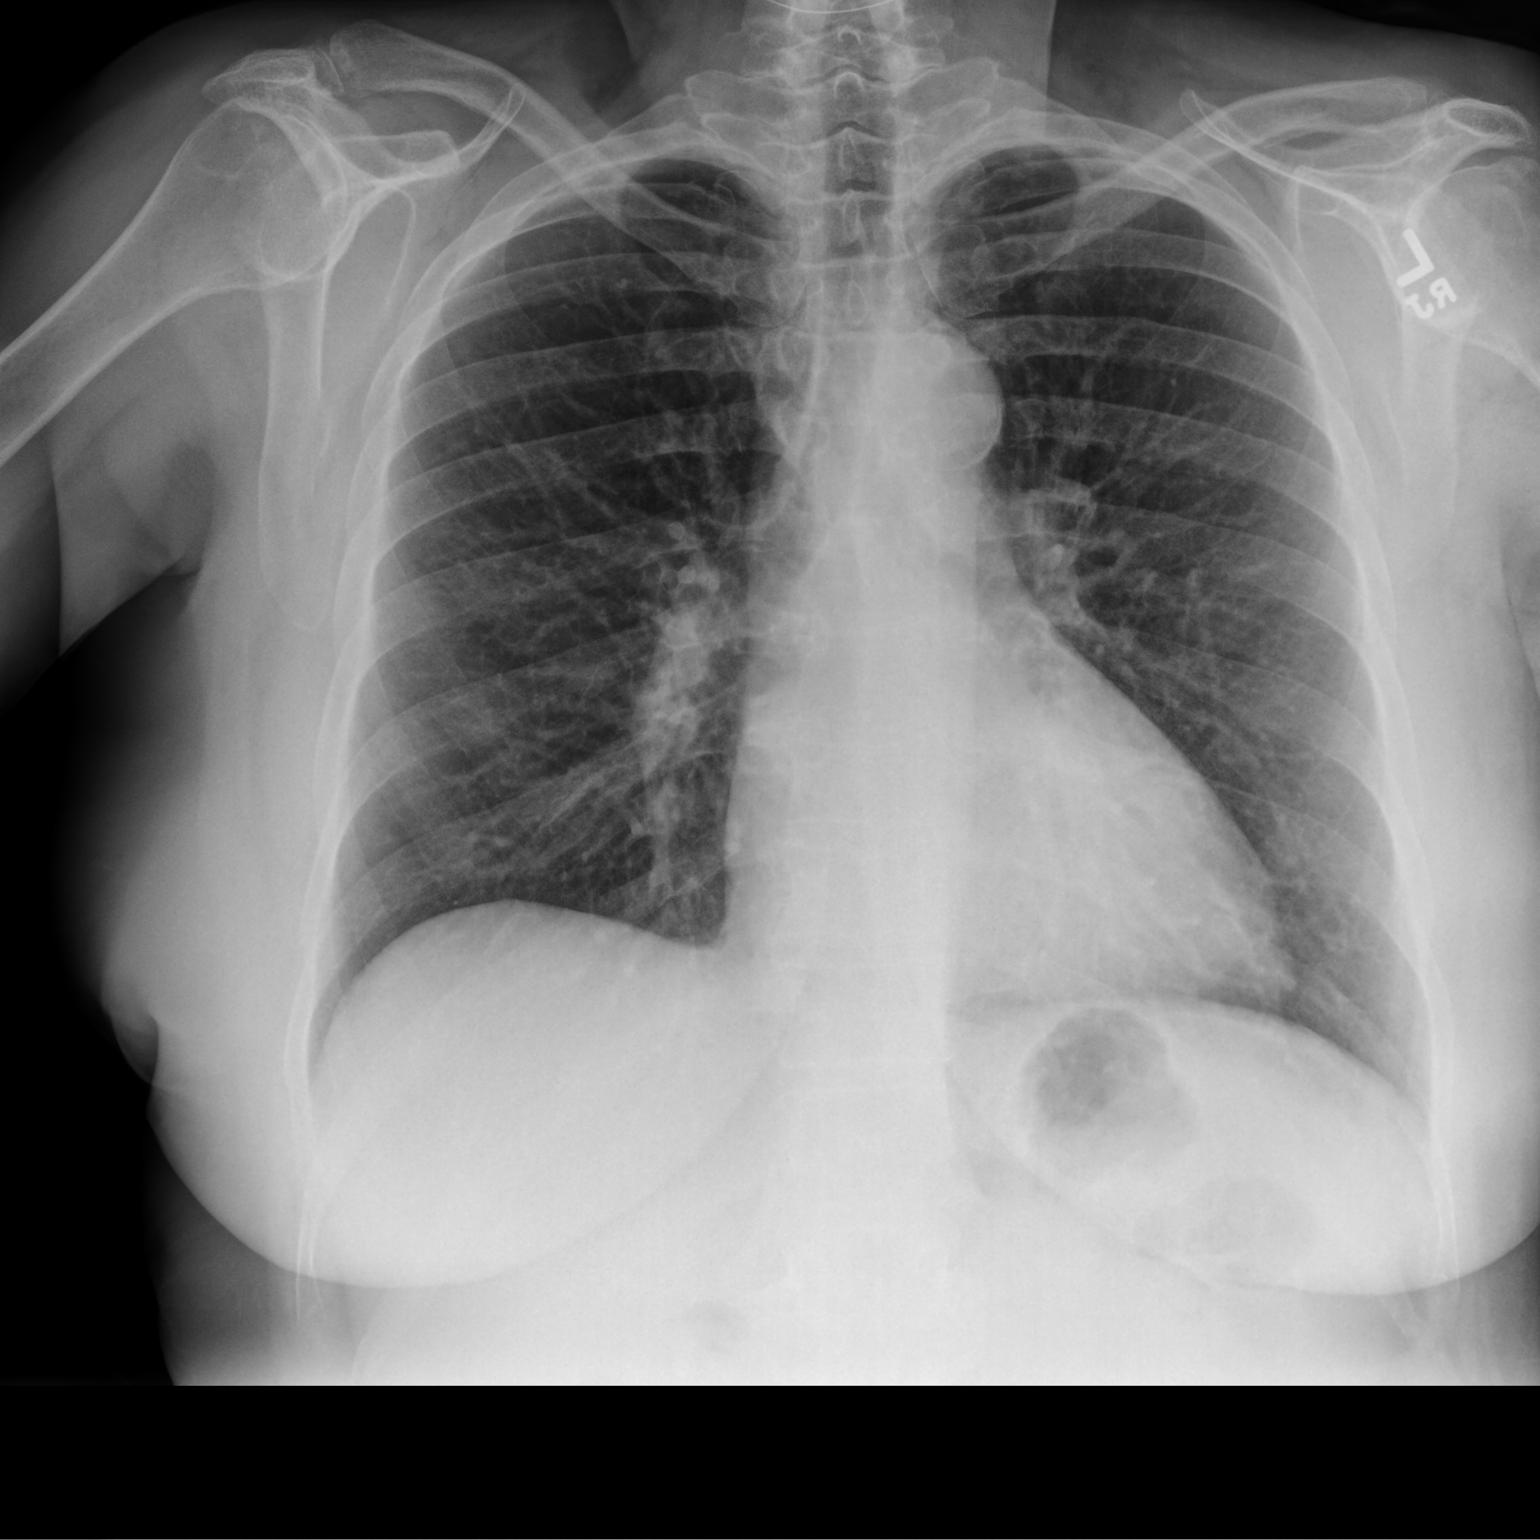

[chest lat]
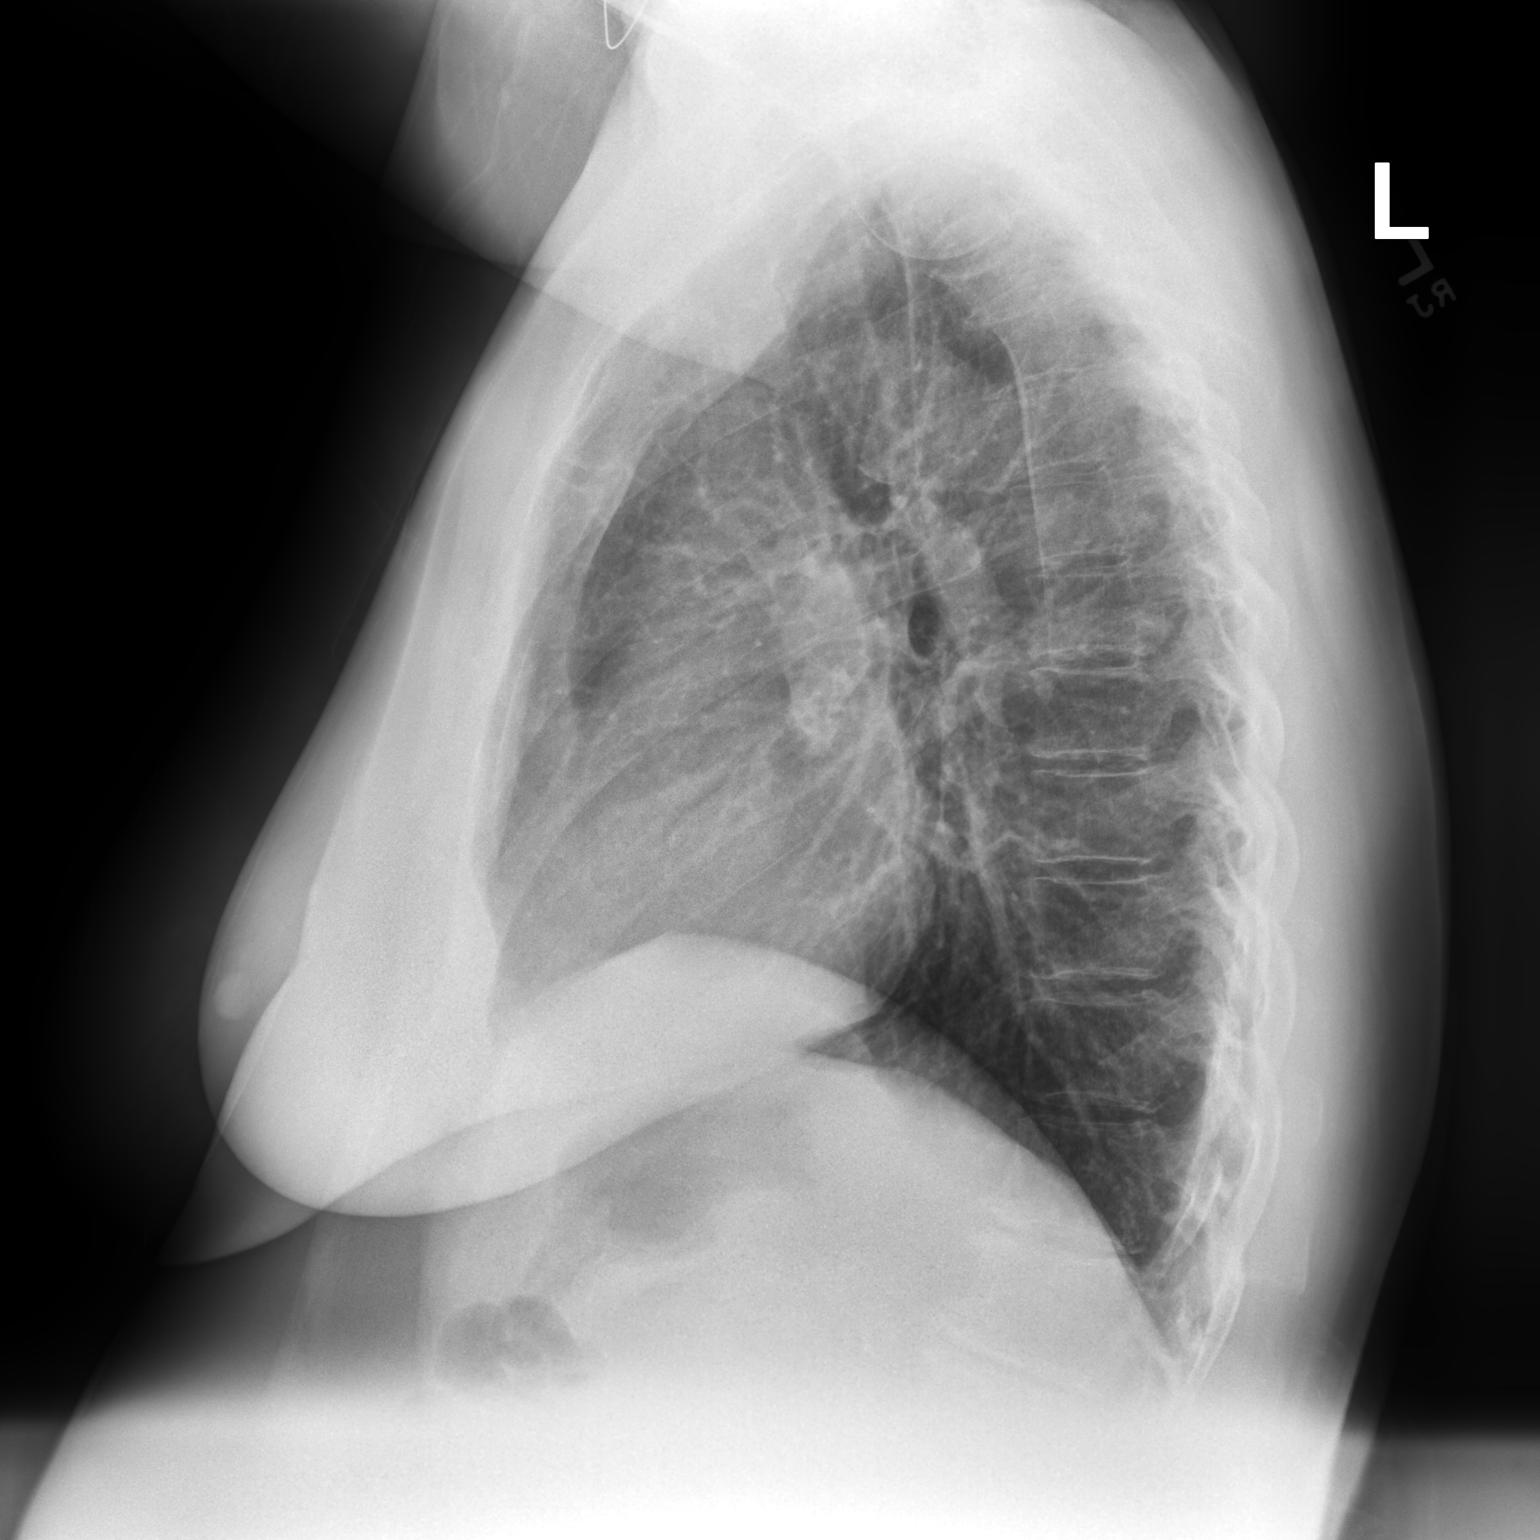

[2 of 2 positions shown; findings below may reference images not displayed]

FINDINGS: The heart size and mediastinal contours are within normal limits.
Both lungs are clear. No visible pleural effusions or pneumothorax.
No acute osseous abnormality.
IMPRESSION: No active cardiopulmonary disease.

## 2023-03-11 ENCOUNTER — Other Ambulatory Visit: Payer: Self-pay | Admitting: Cardiology

## 2023-03-19 IMAGING — DX DG CHEST 2V
2 series · 2 of 2 positions shown · non-contrast
Comparison: 03/08/2021

CLINICAL DATA: Shortness of breath

EXAM:
CHEST - 2 VIEW

[chest pa]
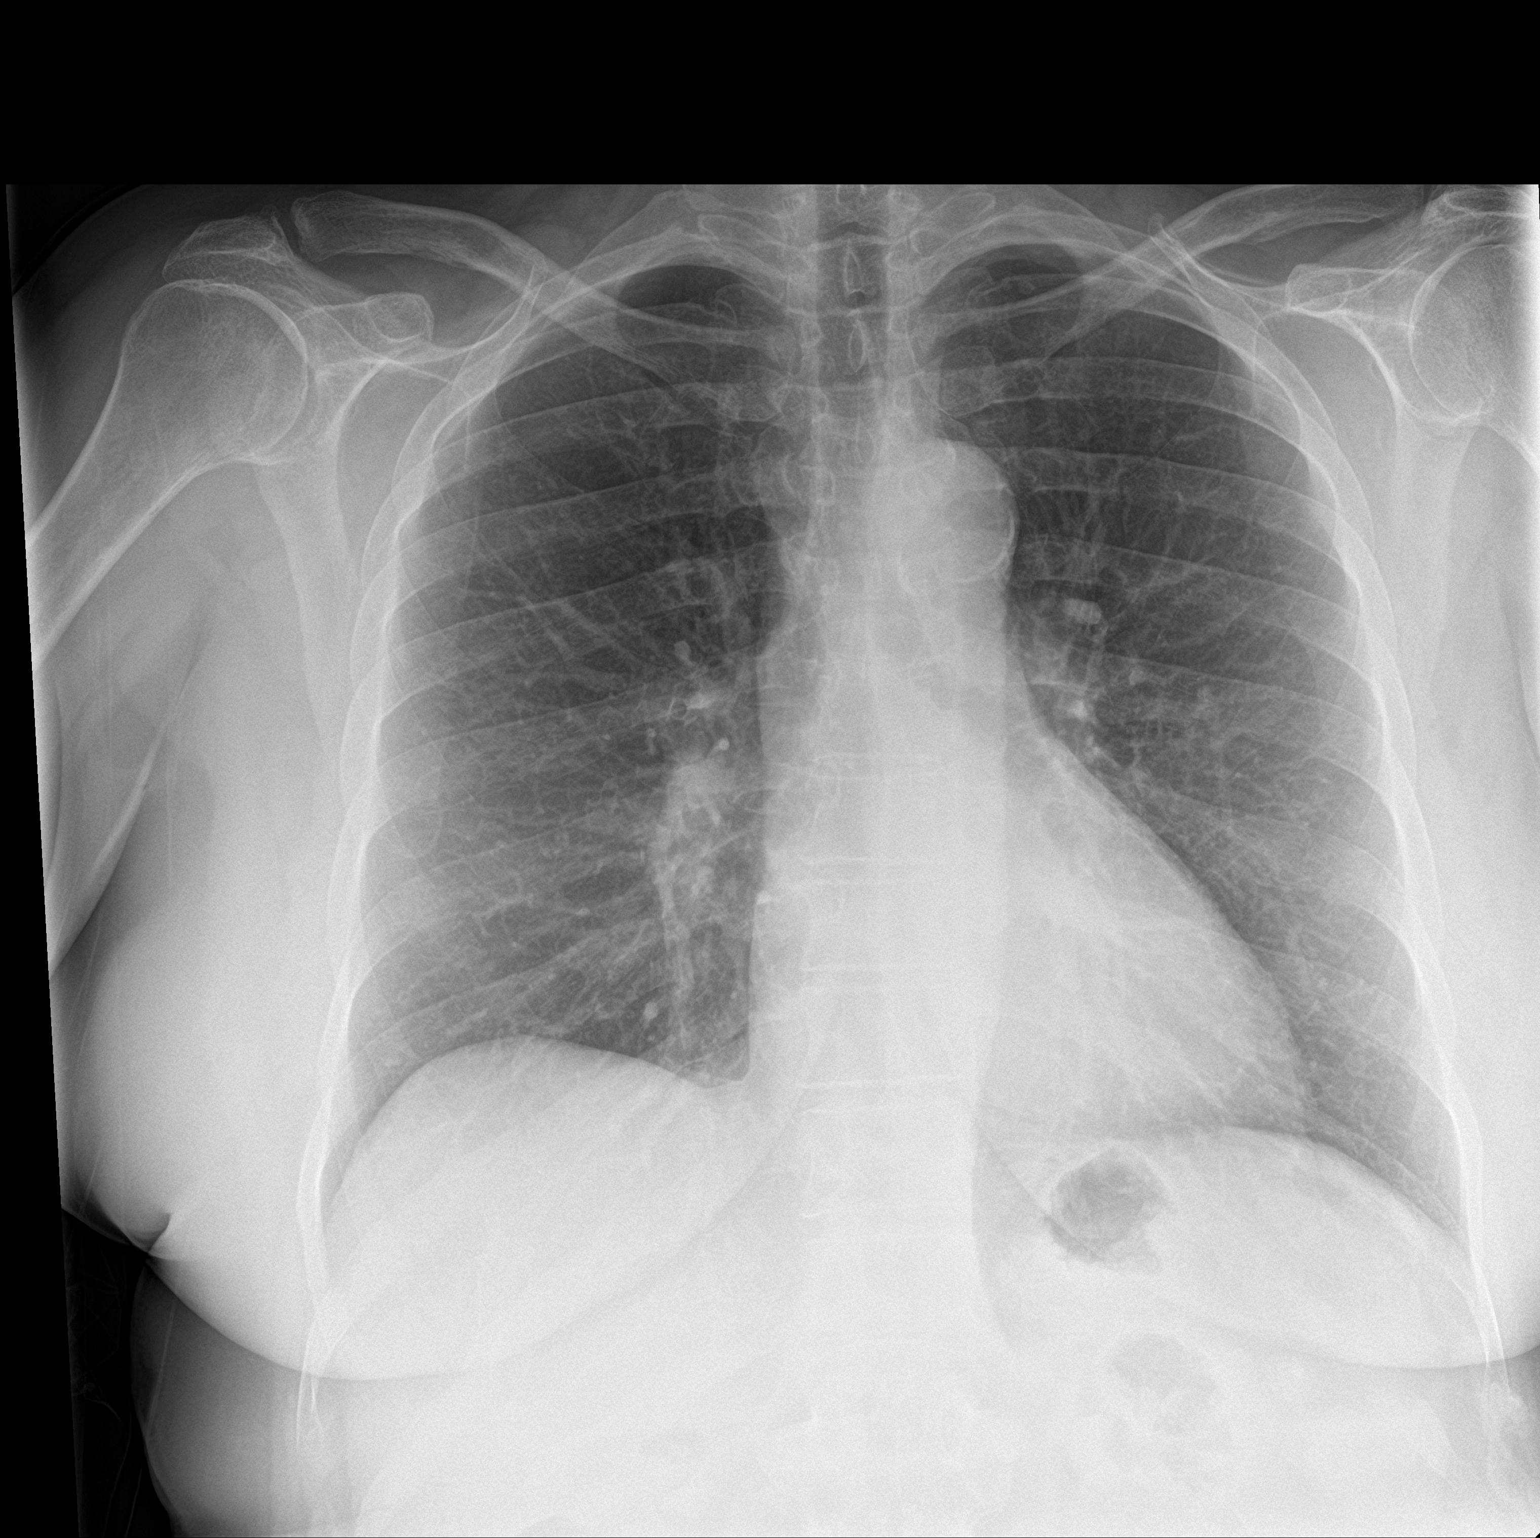

[chest lat]
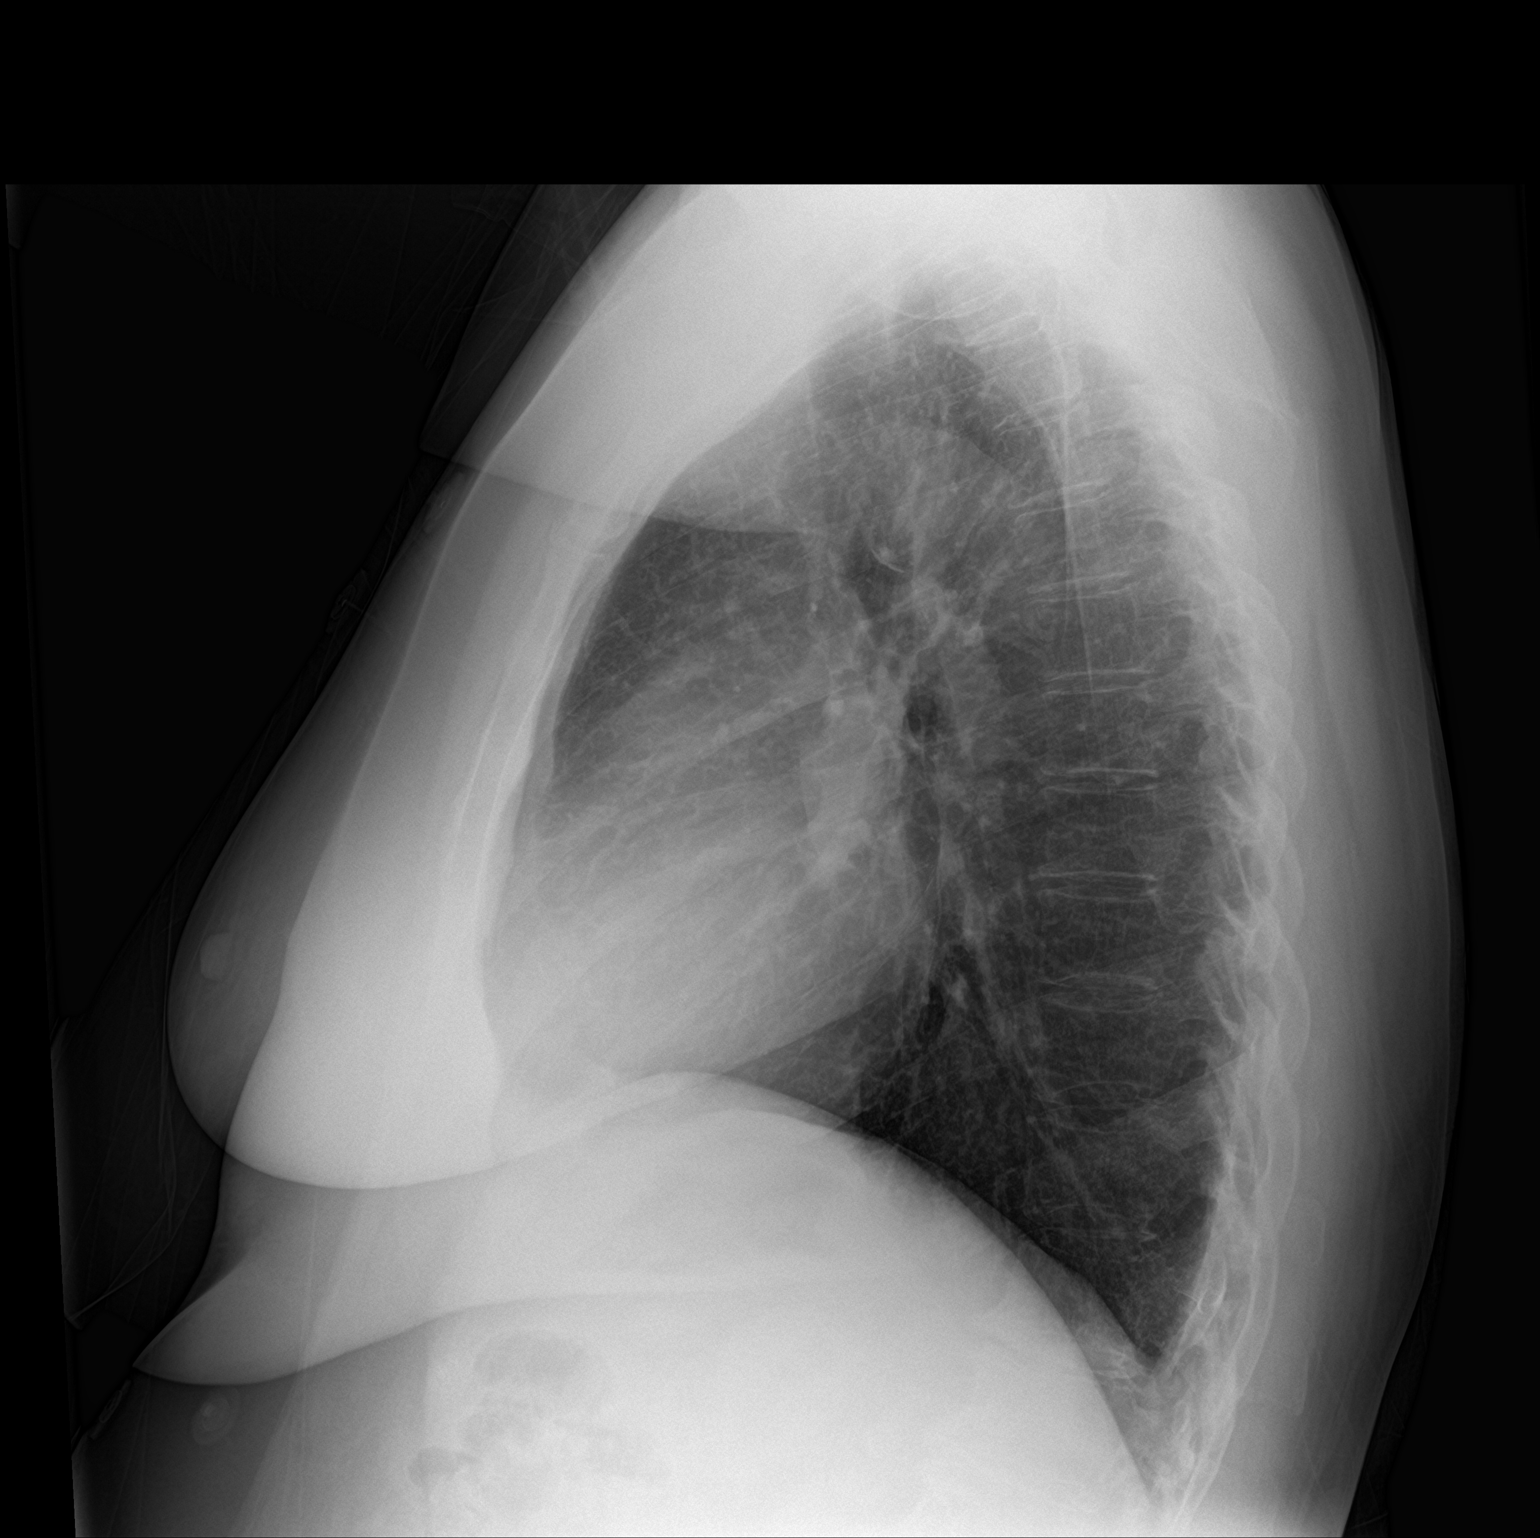

[2 of 2 positions shown; findings below may reference images not displayed]

FINDINGS: The heart size and mediastinal contours are within normal limits.
Aortic atherosclerotic calcifications. Both lungs are clear. No
acute osseous abnormality.
IMPRESSION: No active cardiopulmonary disease.

## 2023-03-20 ENCOUNTER — Ambulatory Visit: Payer: Medicaid Other | Admitting: Cardiology

## 2023-03-26 NOTE — Progress Notes (Signed)
Cardiology Office Note:  .   Date:  03/27/2023  ID:  Walker Shadow, DOB [birth year], MRN [id number] PCP: Audie Pinto, FNP  [facility] Providers Cardiologist:  None    History of Present Illness: .   Meagan Doyle is a 57 y.o. female with a past medical history of coronary artery calcifications noted on CT imaging, hypertension, pulmonary embolism, GERD, hyperlipidemia, history of DVT.  Echo on 03/13/2022 revealed an EF 55 to 60%, mild to moderately dilated left atrium, mild to moderately MR.  Most recently evaluated by Dr. Bing Matter on 09/05/2022, at this time he was doing well from a cardiac perspective, she had recently cut back on her diuretics secondary to a UTI and was having some edema in her legs.  She presents today for follow-up of ongoing shortness of breath and intermittent chest pain.  Chest pain has mixed features, can occur at rest or with exertion, can last for minutes or much longer, hard for her to explain.  Shortness of breath have been persistent for some time.  She is also bothered by bilateral leg pain and pedal edema although I do not appreciate any edema upon examination today, it does appear she has venous insufficiency and lymphedema.  She also endorses episodes where she feels like her heart is "fluttering", states that this is just occurred over the last few months that she was last year and she had never had this occur before.  She denies palpitations, dyspnea, pnd, orthopnea, n, v, dizziness, syncope, edema, weight gain, or early satiety.   ROS: Review of Systems  Constitutional:  Positive for malaise/fatigue.  HENT: Negative.    Eyes: Negative.   Respiratory:  Positive for shortness of breath. Negative for wheezing.   Cardiovascular:  Positive for chest pain, palpitations and leg swelling.  Gastrointestinal: Negative.   Genitourinary: Negative.   Musculoskeletal:  Positive for myalgias.  Skin: Negative.   Neurological: Negative.    Endo/Heme/Allergies: Negative.   Psychiatric/Behavioral: Negative.       Studies Reviewed: .        Cardiac Studies & Procedures       ECHOCARDIOGRAM  ECHOCARDIOGRAM COMPLETE 03/13/2022  Narrative ECHOCARDIOGRAM REPORT    Patient Name:   [facility] Date of Exam: 03/13/2022 Medical Rec #:  [id number]          Height:       68.0 in Accession #:    [id number]         Weight:       240.6 lb Date of Birth:  [birth year]          BSA:          2.211 m Patient Age:    56 years           BP:           137/84 mmHg Patient Gender: F                  HR:           68 bpm. Exam Location:  [place]  Procedure: 2D Echo, Cardiac Doppler, Color Doppler, Strain Analysis and Intracardiac Opacification Agent  Indications:    Aortic atherosclerosis (HCC) [I70.0],Acute pulmonary embolism  History:        Patient has prior history of Echocardiogram examinations, most recent 06/05/2021. Pulmonary embolism and infarction, Signs/Symptoms:Fatigue and Edema; Risk Factors:Hypertension.  Sonographer:    Louie Boston RDCS Referring Phys: [phone] Georgeanna Lea  IMPRESSIONS  1. Left ventricular ejection fraction, by estimation, is 55 to 60%. The left ventricle has normal function. The left ventricle has no regional wall motion abnormalities. Left ventricular diastolic parameters were normal. 2. Right ventricular systolic function is normal. The right ventricular size is normal. There is normal pulmonary artery systolic pressure. 3. Left atrial size was mild to moderately dilated. 4. The mitral valve is normal in structure. Mild to moderate mitral valve regurgitation. No evidence of mitral stenosis. 5. The aortic valve is normal in structure. Aortic valve regurgitation is not visualized. No aortic stenosis is present. 6. The inferior vena cava is normal in size with greater than 50% respiratory variability, suggesting right atrial pressure of 3 mmHg.  FINDINGS Left Ventricle: Left ventricular  ejection fraction, by estimation, is 55 to 60%. The left ventricle has normal function. The left ventricle has no regional wall motion abnormalities. Definity contrast agent was given IV to delineate the left ventricular endocardial borders. The left ventricular internal cavity size was normal in size. There is no left ventricular hypertrophy. Left ventricular diastolic parameters were normal.  Right Ventricle: The right ventricular size is normal. No increase in right ventricular wall thickness. Right ventricular systolic function is normal. There is normal pulmonary artery systolic pressure. The tricuspid regurgitant velocity is 2.45 m/s, and with an assumed right atrial pressure of 3 mmHg, the estimated right ventricular systolic pressure is 27.0 mmHg.  Left Atrium: Left atrial size was mild to moderately dilated.  Right Atrium: Right atrial size was normal in size.  Pericardium: There is no evidence of pericardial effusion.  Mitral Valve: The mitral valve is normal in structure. Mild to moderate mitral valve regurgitation. No evidence of mitral valve stenosis.  Tricuspid Valve: The tricuspid valve is normal in structure. Tricuspid valve regurgitation is mild . No evidence of tricuspid stenosis.  Aortic Valve: The aortic valve is normal in structure. Aortic valve regurgitation is not visualized. No aortic stenosis is present.  Pulmonic Valve: The pulmonic valve was normal in structure. Pulmonic valve regurgitation is not visualized. No evidence of pulmonic stenosis.  Aorta: The aortic root is normal in size and structure.  Venous: The inferior vena cava is normal in size with greater than 50% respiratory variability, suggesting right atrial pressure of 3 mmHg.  IAS/Shunts: No atrial level shunt detected by color flow Doppler.   LEFT VENTRICLE PLAX 2D LVIDd:         5.30 cm     Diastology LVIDs:         3.90 cm     LV e' medial:    5.66 cm/s LV PW:         1.20 cm     LV E/e' medial:   14.8 LV IVS:        1.20 cm     LV e' lateral:   8.05 cm/s LVOT diam:     2.00 cm     LV E/e' lateral: 10.4 LV SV:         65 LV SV Index:   30 LVOT Area:     3.14 cm  LV Volumes (MOD) LV vol d, MOD A2C: 84.3 ml LV vol d, MOD A4C: 89.6 ml LV vol s, MOD A2C: 41.5 ml LV vol s, MOD A4C: 42.0 ml LV SV MOD A2C:     42.8 ml LV SV MOD A4C:     89.6 ml LV SV MOD BP:      46.8 ml  RIGHT VENTRICLE  IVC RV S prime:     14.90 cm/s  IVC diam: 1.80 cm TAPSE (M-mode): 2.4 cm  LEFT ATRIUM             Index        RIGHT ATRIUM           Index LA diam:        4.00 cm 1.81 cm/m   RA Area:     20.20 cm LA Vol (A2C):   51.6 ml 23.34 ml/m  RA Volume:   62.95 ml  28.47 ml/m LA Vol (A4C):   61.8 ml 27.95 ml/m LA Biplane Vol: 60.1 ml 27.19 ml/m AORTIC VALVE LVOT Vmax:   85.70 cm/s LVOT Vmean:  65.800 cm/s LVOT VTI:    0.208 m  AORTA Ao Root diam: 3.30 cm Ao Asc diam:  3.40 cm Ao Desc diam: 2.20 cm  MITRAL VALVE               TRICUSPID VALVE MV Area (PHT): 3.02 cm    TR Peak grad:   24.0 mmHg MV Decel Time: 251 msec    TR Vmax:        245.00 cm/s MV E velocity: 83.90 cm/s MV A velocity: 60.60 cm/s  SHUNTS MV E/A ratio:  1.38        Systemic VTI:  0.21 m Systemic Diam: 2.00 cm  Belva Crome MD Electronically signed by Belva Crome MD Signature Date/Time: 03/13/2022/4:05:15 PM    Final             Risk Assessment/Calculations:     HYPERTENSION CONTROL Vitals:   03/27/23 1317 03/27/23 1545  BP: (!) 140/80 (!) 170/98    The patient's blood pressure is elevated above target today.  In order to address the patient's elevated BP: Blood pressure will be monitored at home to determine if medication changes need to be made.          Physical Exam:   VS:  BP (!) 170/98   Pulse 77   Ht 5\' 8"  (1.727 m)   Wt 256 lb 9.6 oz (116.4 kg)   SpO2 96%   BMI 39.02 kg/m    Wt Readings from Last 3 Encounters:  03/27/23 256 lb 9.6 oz (116.4 kg)  09/05/22 251 lb 6.4 oz  (114 kg)  03/21/22 250 lb 9.6 oz (113.7 kg)    GEN: Well nourished, well developed in no acute distress NECK: No JVD; No carotid bruits CARDIAC: RRR, no murmurs, rubs, gallops RESPIRATORY:  Clear to auscultation without rales, wheezing or rhonchi  ABDOMEN: Soft, non-tender, non-distended EXTREMITIES:  No edema; No deformity   ASSESSMENT AND PLAN: .   Coronary artery calcifications on CT imaging/precordial pain/DOE -she has some intermittent chest pain that is hard for her to describe, does not sound anginal in nature however she does have comorbid conditions.  She would not be able to do a exercise stress test due to leg pain.  Will proceed with a coronary CTA since her DOE could be an anginal equivalent.  Will check a proBNP for her DOE as well.  Palpitations-she describes a fluttering sensation that has begun occurring with increased frequency over the last few weeks. Will check BMET, CBC, magnesium, TSH.  Will arrange for 2-week monitor to assess for sources of palpitations.  Hypertension -her blood pressure is not controlled today, initial blood pressure reading was 140/80, recheck was 178/90, she states it is typically well-controlled at home, not sure if there is a  component of whitecoat hypertension however have asked her to keep a blood pressure log.  If her blood pressure is still elevated at her next OV we will increase her Norvasc.  Continue losartan 50 mg daily.  History of DVT/PE -anticoagulated on Xarelto, this is managed by her PCP.  Hyperlipidemia -most recent LDL was 80, continue Lipitor 40 mg daily.       Dispo: Coronary CTA, 2-week monitor, CBC, BMET, TSH, magnesium, proBNP. Return in 6 weeks.   Signed, Flossie Dibble, NP

## 2023-03-27 ENCOUNTER — Ambulatory Visit: Payer: Medicaid Other | Attending: Cardiology | Admitting: Cardiology

## 2023-03-27 ENCOUNTER — Telehealth: Payer: Self-pay | Admitting: Cardiology

## 2023-03-27 ENCOUNTER — Ambulatory Visit: Payer: Medicaid Other | Attending: Cardiology

## 2023-03-27 ENCOUNTER — Encounter: Payer: Self-pay | Admitting: Cardiology

## 2023-03-27 VITALS — BP 170/98 | HR 77 | Ht 68.0 in | Wt 256.6 lb

## 2023-03-27 DIAGNOSIS — R072 Precordial pain: Secondary | ICD-10-CM

## 2023-03-27 DIAGNOSIS — E782 Mixed hyperlipidemia: Secondary | ICD-10-CM

## 2023-03-27 DIAGNOSIS — I10 Essential (primary) hypertension: Secondary | ICD-10-CM

## 2023-03-27 DIAGNOSIS — Z86718 Personal history of other venous thrombosis and embolism: Secondary | ICD-10-CM

## 2023-03-27 DIAGNOSIS — R002 Palpitations: Secondary | ICD-10-CM | POA: Diagnosis not present

## 2023-03-27 DIAGNOSIS — Z86711 Personal history of pulmonary embolism: Secondary | ICD-10-CM

## 2023-03-27 DIAGNOSIS — R0600 Dyspnea, unspecified: Secondary | ICD-10-CM

## 2023-03-27 MED ORDER — TORSEMIDE 20 MG PO TABS: 20.0000 mg | ORAL_TABLET | Freq: Every day | ORAL | 3 refills | Status: DC

## 2023-03-27 MED ORDER — METOPROLOL TARTRATE 100 MG PO TABS: 100.0000 mg | ORAL_TABLET | Freq: Once | ORAL | 0 refills | Status: DC

## 2023-03-27 NOTE — Patient Instructions (Signed)
Medication Instructions:  Your physician recommends that you continue on your current medications as directed. Please refer to the Current Medication list given to you today.  *If you need a refill on your cardiac medications before your next appointment, please call your pharmacy*   Lab Work: Your physician recommends that you return for lab work in: Today for a BMP, CBC, Magnesium, TSH, ProBnp  If you have labs (blood work) drawn today and your tests are completely normal, you will receive your results only by: MyChart Message (if you have MyChart) OR A paper copy in the mail If you have any lab test that is abnormal or we need to change your treatment, we will call you to review the results.   Testing/Procedures: Please arrive at the [facility] main entrance of [facility] at xx:xx AM (30-45 minutes prior to test start time)  [facility] [address] [place], [place] [address] [phone]  Proceed to the [facility] Radiology Department (First Floor).  Please follow these instructions carefully (unless otherwise directed):   On the Night Before the Test: Drink plenty of water. Do not consume any caffeinated/decaffeinated beverages or chocolate 12 hours prior to your test. Do not take any antihistamines 12 hours prior to your test. If you take Metformin do not take 24 hours prior to test. On the Day of the Test: Drink plenty of water. Do not drink any water within one hour of the test. Do not eat any food 4 hours prior to the test. You may take your regular medications prior to the test. IF NOT ON A BETA BLOCKER - Take 50 mg of lopressor (metoprolol) one hour before the test. HOLD Furosemide morning of the test.  After the Test: Drink plenty of water. After receiving IV contrast, you may experience a mild flushed feeling. This is normal. On occasion, you may experience a mild rash up to 24 hours after the test. This is not dangerous. If this  occurs, you can take Benadryl 25 mg and increase your fluid intake. If you experience trouble breathing, this can be serious. If it is severe call 911 IMMEDIATELY. If it is mild, please call our office. If you take any of these medications: Glipizide/Metformin, Avandament, Glucavance, please do not take 48 hours after completing test.    Follow-Up: At [facility], you and your health needs are our priority.  As part of our continuing mission to provide you with exceptional heart care, we have created designated Provider Care Teams.  These Care Teams include your primary Cardiologist (physician) and Advanced Practice Providers (APPs -  Physician Assistants and Nurse Practitioners) who all work together to provide you with the care you need, when you need it.  We recommend signing up for the patient portal called "MyChart".  Sign up information is provided on this After Visit Summary.  MyChart is used to connect with patients for Virtual Visits (Telemedicine).  Patients are able to view lab/test results, encounter notes, upcoming appointments, etc.  Non-urgent messages can be sent to your provider as well.   To learn more about what you can do with MyChart, go to [email or web address].    Your next appointment:   6 week(s)  Provider:   Wallis Bamberg, NP Rosalita Levan)    Other Instructions

## 2023-03-27 NOTE — Telephone Encounter (Signed)
Pt c/o medication issue:  1. Name of Medication:   furosemide (LASIX) 40 MG tablet   2. How are you currently taking this medication (dosage and times per day)?   As prescribed  3. Are you having a reaction (difficulty breathing--STAT)?   No  4. What is your medication issue?   Patient stated it was discussed at today's visit this medication was to be changed.  Patient wants to know which medication this is being changed to.

## 2023-03-27 NOTE — Addendum Note (Signed)
Addended by: Baldo Ash D on: 03/27/2023 05:12 PM   Modules accepted: Orders

## 2023-03-28 LAB — CBC WITH DIFFERENTIAL/PLATELET
Basophils Absolute: 0 x10E3/uL (ref 0.0–0.2)
Basos: 1 %
EOS (ABSOLUTE): 0.1 x10E3/uL (ref 0.0–0.4)
Eos: 3 %
Hematocrit: 37.6 % (ref 34.0–46.6)
Hemoglobin: 12.5 g/dL (ref 11.1–15.9)
Immature Grans (Abs): 0 x10E3/uL (ref 0.0–0.1)
Immature Granulocytes: 0 %
Lymphocytes Absolute: 2 x10E3/uL (ref 0.7–3.1)
Lymphs: 39 %
MCH: 27.8 pg (ref 26.6–33.0)
MCHC: 33.2 g/dL (ref 31.5–35.7)
MCV: 84 fL (ref 79–97)
Monocytes Absolute: 0.3 x10E3/uL (ref 0.1–0.9)
Monocytes: 6 %
Neutrophils Absolute: 2.7 x10E3/uL (ref 1.4–7.0)
Neutrophils: 51 %
Platelets: 311 x10E3/uL (ref 150–450)
RBC: 4.5 x10E6/uL (ref 3.77–5.28)
RDW: 14.4 % (ref 11.7–15.4)
WBC: 5.2 x10E3/uL (ref 3.4–10.8)

## 2023-03-28 LAB — TSH: TSH: 2.01 u[IU]/mL (ref 0.450–4.500)

## 2023-03-28 LAB — BASIC METABOLIC PANEL WITH GFR
BUN/Creatinine Ratio: 18 (ref 9–23)
BUN: 13 mg/dL (ref 6–24)
CO2: 21 mmol/L (ref 20–29)
Calcium: 9.6 mg/dL (ref 8.7–10.2)
Chloride: 105 mmol/L (ref 96–106)
Creatinine, Ser: 0.74 mg/dL (ref 0.57–1.00)
Glucose: 100 mg/dL — ABNORMAL HIGH (ref 70–99)
Potassium: 4.3 mmol/L (ref 3.5–5.2)
Sodium: 141 mmol/L (ref 134–144)
eGFR: 95 mL/min/1.73

## 2023-03-28 LAB — MAGNESIUM: Magnesium: 2 mg/dL (ref 1.6–2.3)

## 2023-03-28 LAB — PRO B NATRIURETIC PEPTIDE: NT-Pro BNP: 140 pg/mL (ref 0–287)

## 2023-03-31 ENCOUNTER — Encounter (HOSPITAL_COMMUNITY): Payer: Self-pay

## 2023-04-01 ENCOUNTER — Telehealth (HOSPITAL_COMMUNITY): Payer: Self-pay | Admitting: *Deleted

## 2023-04-01 NOTE — Telephone Encounter (Signed)
Attempted to call patient to reschedule her cardiac CT appointment due to insurance issues.  Left message on voicemail with name and callback number  Larey Brick RN Navigator Cardiac Imaging [facility] [phone] Office [phone] Cell

## 2023-04-03 ENCOUNTER — Ambulatory Visit (HOSPITAL_COMMUNITY): Admission: RE | Admit: 2023-04-03 | Payer: Medicaid Other | Source: Ambulatory Visit

## 2023-04-09 ENCOUNTER — Telehealth (HOSPITAL_COMMUNITY): Payer: Self-pay | Admitting: *Deleted

## 2023-04-09 NOTE — Telephone Encounter (Signed)
Calling patient again to reschedule CCTA appointment due to lack of insurance authorization.  Larey Brick RN Navigator Cardiac Imaging [facility] [phone] Office [phone] Cell

## 2023-04-10 ENCOUNTER — Ambulatory Visit (HOSPITAL_COMMUNITY): Admission: RE | Admit: 2023-04-10 | Payer: Medicaid Other | Source: Ambulatory Visit

## 2023-04-15 ENCOUNTER — Telehealth (HOSPITAL_COMMUNITY): Payer: Self-pay | Admitting: *Deleted

## 2023-04-15 NOTE — Telephone Encounter (Signed)
Attempted to call patient regarding upcoming cardiac CT appointment. °Left message on voicemail with name and callback number ° °Merle Prescott RN Navigator Cardiac Imaging °[facility] °[phone] Office °[phone] Cell ° °

## 2023-04-17 ENCOUNTER — Ambulatory Visit (HOSPITAL_COMMUNITY)
Admission: RE | Admit: 2023-04-17 | Discharge: 2023-04-17 | Disposition: A | Discharge status: Home / Self Care | Payer: Medicaid Other | Source: Ambulatory Visit | Attending: Cardiology | Admitting: Cardiology

## 2023-04-17 DIAGNOSIS — R072 Precordial pain: Secondary | ICD-10-CM | POA: Diagnosis not present

## 2023-04-17 MED ORDER — NITROGLYCERIN 0.4 MG SL SUBL
0.8000 mg | SUBLINGUAL_TABLET | Freq: Once | SUBLINGUAL | Status: AC
  Administered 2023-04-17: 0.8 mg via SUBLINGUAL

## 2023-04-17 MED ORDER — IOHEXOL 350 MG/ML SOLN
95.0000 mL | Freq: Once | INTRAVENOUS | Status: AC | PRN
  Administered 2023-04-17: 95 mL via INTRAVENOUS

## 2023-04-24 ENCOUNTER — Telehealth: Payer: Self-pay

## 2023-04-24 MED ORDER — METOPROLOL TARTRATE 25 MG PO TABS: 25.0000 mg | ORAL_TABLET | Freq: Two times a day (BID) | ORAL | 3 refills | Status: DC

## 2023-04-24 NOTE — Telephone Encounter (Signed)
-----   Message from Flossie Dibble sent at 04/24/2023  2:46 PM EDT ----- Monitor showed she is having episodes of SVT, the fluttering sensation she was experiencing. Lets start her on metoprolol tartrate 25 mg twice daily to see if that helps.

## 2023-05-08 ENCOUNTER — Encounter: Payer: Self-pay | Admitting: Cardiology

## 2023-05-08 ENCOUNTER — Ambulatory Visit: Payer: Medicaid Other | Attending: Cardiology | Admitting: Cardiology

## 2023-05-08 VITALS — BP 170/92 | HR 82 | Ht 68.0 in | Wt 261.0 lb

## 2023-05-08 DIAGNOSIS — Z86711 Personal history of pulmonary embolism: Secondary | ICD-10-CM

## 2023-05-08 DIAGNOSIS — I340 Nonrheumatic mitral (valve) insufficiency: Secondary | ICD-10-CM

## 2023-05-08 DIAGNOSIS — I2510 Atherosclerotic heart disease of native coronary artery without angina pectoris: Secondary | ICD-10-CM | POA: Diagnosis not present

## 2023-05-08 DIAGNOSIS — I10 Essential (primary) hypertension: Secondary | ICD-10-CM | POA: Diagnosis not present

## 2023-05-08 DIAGNOSIS — Z86718 Personal history of other venous thrombosis and embolism: Secondary | ICD-10-CM

## 2023-05-08 DIAGNOSIS — R0609 Other forms of dyspnea: Secondary | ICD-10-CM

## 2023-05-08 DIAGNOSIS — R0683 Snoring: Secondary | ICD-10-CM

## 2023-05-08 MED ORDER — AMLODIPINE BESYLATE 5 MG PO TABS: 5.0000 mg | ORAL_TABLET | Freq: Every day | ORAL | 3 refills | Status: DC

## 2023-05-08 MED ORDER — TORSEMIDE 20 MG PO TABS: 20.0000 mg | ORAL_TABLET | Freq: Two times a day (BID) | ORAL | 3 refills | Status: AC

## 2023-05-08 MED ORDER — METOPROLOL TARTRATE 50 MG PO TABS: 50.0000 mg | ORAL_TABLET | Freq: Two times a day (BID) | ORAL | 3 refills | Status: DC

## 2023-05-08 NOTE — Progress Notes (Signed)
Cardiology Office Note:  .   Date:  05/08/2023  ID:  Meagan Doyle, DOB 04/11/1966, MRN [id number] PCP: Audie Pinto, FNP  [facility] Providers Cardiologist:  Gypsy Balsam, MD    History of Present Illness: .   Meagan Doyle is a 57 y.o. female with a past medical history of coronary artery calcifications noted on CT imaging, hypertension, pulmonary embolism, GERD, hyperlipidemia, history of DVT, positive ANA.  Echo on 03/13/2022 revealed an EF 55 to 60%, mild to moderately dilated left atrium, mild to moderately MR.  Evaluated by Dr. Bing Matter on 09/05/2022, at this time she was doing well from a cardiac perspective, she had recently cut back on her diuretics secondary to a UTI and was having some edema in her legs.  Most recently she was evaluated on 03/27/2023 with complaints of chest pain that had mixed features as well as pedal edema, palpitations. We arranged a coronary CTA revealing a coronary calcium score of 99 placing her in the 94th percentile, minimally nonobstructive CAD, incidental finding of PFO.  We repeated a monitor which revealed heart rate of 48 to 235 bpm with an average of 78 bpm, predominant underlying rhythm was sinus, 128 episodes of SVT occurred and she was started on metoprolol.   She presents today for follow-up of her coronary artery disease and fatigue.  She has noted her start palpitations have decreased somewhat however are still bothersome for her.  She also complains of pedal edema, and DOE.  It is hard for her to decipher how long her symptoms have been going on for, she states she generally started feeling unwell after pulmonary embolism and multiple DVTs.  She is being followed by rheumatology, she recently was evaluated by them and her ANA was positive.  She denies chest pain, palpitations, dyspnea, pnd, orthopnea, n, v, dizziness, syncope,  weight gain, or early satiety.    ROS: Review of Systems  Constitutional:  Positive for  malaise/fatigue.  HENT: Negative.    Eyes: Negative.   Respiratory:  Positive for shortness of breath. Negative for wheezing.   Cardiovascular:  Positive for chest pain, palpitations and leg swelling.  Gastrointestinal: Negative.   Genitourinary: Negative.   Musculoskeletal:  Positive for myalgias.  Skin: Negative.   Neurological: Negative.   Endo/Heme/Allergies: Negative.   Psychiatric/Behavioral: Negative.       Studies Reviewed: .        Cardiac Studies & Procedures       ECHOCARDIOGRAM  ECHOCARDIOGRAM COMPLETE 03/13/2022  Narrative ECHOCARDIOGRAM REPORT    Patient Name:   Meagan Doyle Date of Exam: 03/13/2022 Medical Rec #:  [id number]          Height:       68.0 in Accession #:    [id number]         Weight:       240.6 lb Date of Birth:  01-Jan-1966          BSA:          2.211 m Patient Age:    56 years           BP:           137/84 mmHg Patient Gender: F                  HR:           68 bpm. Exam Location:  [place]  Procedure: 2D Echo, Cardiac Doppler, Color Doppler, Strain Analysis and Intracardiac Opacification  Agent  Indications:    Aortic atherosclerosis (HCC) [I70.0],Acute pulmonary embolism  History:        Patient has prior history of Echocardiogram examinations, most recent 06/05/2021. Pulmonary embolism and infarction, Signs/Symptoms:Fatigue and Edema; Risk Factors:Hypertension.  Sonographer:    Louie Boston RDCS Referring Phys: [phone] Marveen Reeks KRASOWSKI  IMPRESSIONS   1. Left ventricular ejection fraction, by estimation, is 55 to 60%. The left ventricle has normal function. The left ventricle has no regional wall motion abnormalities. Left ventricular diastolic parameters were normal. 2. Right ventricular systolic function is normal. The right ventricular size is normal. There is normal pulmonary artery systolic pressure. 3. Left atrial size was mild to moderately dilated. 4. The mitral valve is normal in structure. Mild to moderate mitral  valve regurgitation. No evidence of mitral stenosis. 5. The aortic valve is normal in structure. Aortic valve regurgitation is not visualized. No aortic stenosis is present. 6. The inferior vena cava is normal in size with greater than 50% respiratory variability, suggesting right atrial pressure of 3 mmHg.  FINDINGS Left Ventricle: Left ventricular ejection fraction, by estimation, is 55 to 60%. The left ventricle has normal function. The left ventricle has no regional wall motion abnormalities. Definity contrast agent was given IV to delineate the left ventricular endocardial borders. The left ventricular internal cavity size was normal in size. There is no left ventricular hypertrophy. Left ventricular diastolic parameters were normal.  Right Ventricle: The right ventricular size is normal. No increase in right ventricular wall thickness. Right ventricular systolic function is normal. There is normal pulmonary artery systolic pressure. The tricuspid regurgitant velocity is 2.45 m/s, and with an assumed right atrial pressure of 3 mmHg, the estimated right ventricular systolic pressure is 27.0 mmHg.  Left Atrium: Left atrial size was mild to moderately dilated.  Right Atrium: Right atrial size was normal in size.  Pericardium: There is no evidence of pericardial effusion.  Mitral Valve: The mitral valve is normal in structure. Mild to moderate mitral valve regurgitation. No evidence of mitral valve stenosis.  Tricuspid Valve: The tricuspid valve is normal in structure. Tricuspid valve regurgitation is mild . No evidence of tricuspid stenosis.  Aortic Valve: The aortic valve is normal in structure. Aortic valve regurgitation is not visualized. No aortic stenosis is present.  Pulmonic Valve: The pulmonic valve was normal in structure. Pulmonic valve regurgitation is not visualized. No evidence of pulmonic stenosis.  Aorta: The aortic root is normal in size and structure.  Venous: The  inferior vena cava is normal in size with greater than 50% respiratory variability, suggesting right atrial pressure of 3 mmHg.  IAS/Shunts: No atrial level shunt detected by color flow Doppler.   LEFT VENTRICLE PLAX 2D LVIDd:         5.30 cm     Diastology LVIDs:         3.90 cm     LV e' medial:    5.66 cm/s LV PW:         1.20 cm     LV E/e' medial:  14.8 LV IVS:        1.20 cm     LV e' lateral:   8.05 cm/s LVOT diam:     2.00 cm     LV E/e' lateral: 10.4 LV SV:         65 LV SV Index:   30 LVOT Area:     3.14 cm  LV Volumes (MOD) LV vol d, MOD A2C: 84.3 ml  LV vol d, MOD A4C: 89.6 ml LV vol s, MOD A2C: 41.5 ml LV vol s, MOD A4C: 42.0 ml LV SV MOD A2C:     42.8 ml LV SV MOD A4C:     89.6 ml LV SV MOD BP:      46.8 ml  RIGHT VENTRICLE             IVC RV S prime:     14.90 cm/s  IVC diam: 1.80 cm TAPSE (M-mode): 2.4 cm  LEFT ATRIUM             Index        RIGHT ATRIUM           Index LA diam:        4.00 cm 1.81 cm/m   RA Area:     20.20 cm LA Vol (A2C):   51.6 ml 23.34 ml/m  RA Volume:   62.95 ml  28.47 ml/m LA Vol (A4C):   61.8 ml 27.95 ml/m LA Biplane Vol: 60.1 ml 27.19 ml/m AORTIC VALVE LVOT Vmax:   85.70 cm/s LVOT Vmean:  65.800 cm/s LVOT VTI:    0.208 m  AORTA Ao Root diam: 3.30 cm Ao Asc diam:  3.40 cm Ao Desc diam: 2.20 cm  MITRAL VALVE               TRICUSPID VALVE MV Area (PHT): 3.02 cm    TR Peak grad:   24.0 mmHg MV Decel Time: 251 msec    TR Vmax:        245.00 cm/s MV E velocity: 83.90 cm/s MV A velocity: 60.60 cm/s  SHUNTS MV E/A ratio:  1.38        Systemic VTI:  0.21 m Systemic Diam: 2.00 cm  Belva Crome MD Electronically signed by Belva Crome MD Signature Date/Time: 03/13/2022/4:05:15 PM    Final    MONITORS  LONG TERM MONITOR (3-14 DAYS) 04/21/2023  Narrative Patch Wear Time:  13 days and 20 hours (2024-06-28T13:58:20-0400 to 2024-07-12T10:00:45-0400)  Patient had a min HR of 48 bpm, max HR of 235 bpm, and avg HR of  78 bpm. Predominant underlying rhythm was Sinus Rhythm. 128 Supraventricular Tachycardia runs occurred, the run with the fastest interval lasting 10 beats with a max rate of 235 bpm, the longest lasting 21.8 secs with an avg rate of 103 bpm. Some episodes of Supraventricular Tachycardia conducted with possible aberrancy. Supraventricular Tachycardia was detected within +/- 45 seconds of symptomatic patient event(s). Isolated SVEs were rare (<1.0%), SVE Couplets were rare (<1.0%), and SVE Triplets were rare (<1.0%). Isolated VEs were rare (<1.0%), and no VE Couplets or VE Triplets were present.  Summary and conclusions: 2 episodes of ventricular tachycardia noted with heart rate fluctuating between 162 to 235 bpm.  Asymptomatic 128 episode of supraventricular tachycardia noted longest episode 21.8 seconds at rate of 103.  Symptomatic   CT SCANS  CT CORONARY MORPH W/CTA COR W/SCORE 04/17/2023  Addendum 04/26/2023  1:53 PM ADDENDUM REPORT: 04/26/2023 13:51  EXAM: OVER-READ INTERPRETATION  CT CHEST  The following report is an over-read performed by radiologist Dr. Noe Gens [facility] on 04/26/2023. This over-read does not include interpretation of cardiac or coronary anatomy or pathology. The coronary CTA interpretation by the cardiologist is attached.  COMPARISON:  06/04/2021  FINDINGS: Heart is normal size. Aorta normal caliber. No adenopathy. No confluent airspace opacities or effusions. No acute findings in the upper abdomen. Chest wall soft tissues are unremarkable. No acute  bony abnormality.  IMPRESSION: No acute or significant extracardiac abnormality.   Electronically Signed By: Charlett Nose M.D. On: 04/26/2023 13:51  Narrative CLINICAL DATA:  40F with chest pain  EXAM: Cardiac/Coronary CTA  TECHNIQUE: The patient was scanned on a [employer].  FINDINGS: A 100 kV prospective scan was triggered in the descending thoracic aorta at 111  HU's. Axial non-contrast 3 mm slices were carried out through the heart. The data set was analyzed on a dedicated work station and scored using the Agatson method. Gantry rotation speed was 250 msecs and collimation was .6 mm. No beta blockade and 0.8 mg of sl NTG was given. The 3D data set was reconstructed in 5% intervals of the 35-75% of the R-R cycle. Phases were analyzed on a dedicated work station using MPR, MIP and VRT modes. The patient received 100 cc of contrast.  Coronary Arteries:  Normal coronary origin.  Codominance.  RCA is a codominant artery.  There is no plaque.  Left main is a large artery that gives rise to LAD and LCX arteries.  LAD is a large vessel. Calcified plaque in proximal LAD causes 0-24% stenosis. Calcified plaque in mid LAD causes 0-24% stenosis  LCX is a codominant artery that gives rise to one large OM1 branch. There is no plaque.  Other findings:  Left Ventricle: Normal size  Left Atrium: Normal size. PFO  Pulmonary Veins: Normal configuration  Right Ventricle: Mild enlargement  Right Atrium: Mild enlargement  Cardiac valves: No calcifications  Thoracic aorta: Normal size  Pulmonary Arteries: Normal size  Systemic Veins: Normal drainage  Pericardium: Normal thickness  IMPRESSION: 1. Coronary calcium score of 99. This was 94th percentile for age and sex matched control.  2.  Normal coronary origin with codominance.  3. Step artifacts due to respiratory motion during image acquisition affects interpretation  4. Nonobstructive CAD, with calcified plaque in proximal/mid LAD causing minimal (0-24%) stenosis  5.  PFO  CAD-RADS 1. Minimal non-obstructive CAD (0-24%). Consider non-atherosclerotic causes of chest pain. Consider preventive therapy and risk factor modification.  Electronically Signed: By: Epifanio Lesches M.D. On: 04/17/2023 17:06          Risk Assessment/Calculations:     HYPERTENSION CONTROL Vitals:    05/08/23 1331 05/08/23 1446  BP: (!) 160/80 (!) 170/92    The patient's blood pressure is elevated above target today.  In order to address the patient's elevated BP: A current anti-hypertensive medication was adjusted today.      STOP-Bang Score:  6      Physical Exam:   VS:  BP (!) 170/92   Pulse 82   Ht 5\' 8"  (1.727 m)   Wt 261 lb (118.4 kg)   SpO2 97%   BMI 39.68 kg/m    Wt Readings from Last 3 Encounters:  05/08/23 261 lb (118.4 kg)  03/27/23 256 lb 9.6 oz (116.4 kg)  09/05/22 251 lb 6.4 oz (114 kg)    GEN: Appears fatigued, well nourished, well developed in no acute distress NECK: No JVD; No carotid bruits CARDIAC: RRR, no murmurs, rubs, gallops RESPIRATORY:  Clear to auscultation without rales, wheezing or rhonchi  ABDOMEN: Soft, non-tender, non-distended EXTREMITIES: Trace pedal edema, positive varicose veins and venous insufficiency; No deformity   ASSESSMENT AND PLAN: .   Nonobstructive coronary artery disease-coronary CTA revealed a coronary calcium score of 99 placing her in the 94th percentile, minimally nonobstructive CAD, incidental finding of PFO.  She is on Xarelto for DVT, PE  prevention so we will not start aspirin.  Continue atorvastatin 40 mg daily, continue metoprolol.  DOE-this has been persistent for some time, will repeat echocardiogram.  Will increase her Demadex to twice daily.  Recent creatinine was 0.74, will repeat BMET in 2 weeks.  Palpitations-monitor revealed heart rate of 48 to 235 bpm with an average of 78 bpm, predominant underlying rhythm was sinus, 128 episodes of SVT occurred.  We started her on metoprolol 25 mg twice daily however she has not noticed significant improvement, will increase this to 50 mg twice daily.  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  Hyperlipidemia -most recent LDL was 80, continue Lipitor 40 mg daily.  She will return in 2 weeks for FLP and LFTs.  Fatigue/snoring-STOP-BANG score of 6, will arrange for at home [facility].       Dispo: Home sleep study, echocardiogram, increase Norvasc to 5 mg daily, increase metoprolol to 50 mg twice daily.  Will repeat BMET after increasing her diuretic, will repeat FLP and LFTs.  Signed, Flossie Dibble, NP

## 2023-05-08 NOTE — Patient Instructions (Signed)
Medication Instructions:  Your physician has recommended you make the following change in your medication:   START: Torsemide 20 mg twice daily START: Amlodipine 5 mg daily START: Metoprolol Tartrate 50 mg twice daily  *If you need a refill on your cardiac medications before your next appointment, please call your pharmacy*   Lab Work: Your physician recommends that you return for lab work in:   Labs in 2 weeks: CMP, Lipids  If you have labs (blood work) drawn today and your tests are completely normal, you will receive your results only by: MyChart Message (if you have MyChart) OR A paper copy in the mail If you have any lab test that is abnormal or we need to change your treatment, we will call you to review the results.   Testing/Procedures: Your physician has requested that you have an echocardiogram. Echocardiography is a painless test that uses sound waves to create images of your heart. It provides your doctor with information about the size and shape of your heart and how well your heart's chambers and valves are working. This procedure takes approximately one hour. There are no restrictions for this procedure. Please do NOT wear cologne, perfume, aftershave, or lotions (deodorant is allowed). Please arrive 15 minutes prior to your appointment time.  [facility] Home Sleep Study   Follow-Up: At [facility], you and your health needs are our priority.  As part of our continuing mission to provide you with exceptional heart care, we have created designated Provider Care Teams.  These Care Teams include your primary Cardiologist (physician) and Advanced Practice Providers (APPs -  Physician Assistants and Nurse Practitioners) who all work together to provide you with the care you need, when you need it.  We recommend signing up for the patient portal called "MyChart".  Sign up information is provided on this After Visit Summary.  MyChart is used to connect with patients for  Virtual Visits (Telemedicine).  Patients are able to view lab/test results, encounter notes, upcoming appointments, etc.  Non-urgent messages can be sent to your provider as well.   To learn more about what you can do with MyChart, go to [email or web address].    Your next appointment:   6 week(s)  Provider:   Gypsy Balsam, MD    Other Instructions None

## 2023-05-19 ENCOUNTER — Telehealth: Payer: Self-pay | Admitting: Cardiology

## 2023-05-19 NOTE — Telephone Encounter (Signed)
Patient called to follow-up on when she should start her sleep study.

## 2023-05-21 NOTE — Telephone Encounter (Signed)
Spoke with pt regarding Itamar sleep study. Advised that it could be a while before hearing from the authorization. Pt verbalized understanding and had no further questions.

## 2023-05-23 LAB — COMPREHENSIVE METABOLIC PANEL WITH GFR
ALT: 15 IU/L (ref 0–32)
AST: 13 IU/L (ref 0–40)
Albumin: 4.1 g/dL (ref 3.8–4.9)
Alkaline Phosphatase: 142 IU/L — ABNORMAL HIGH (ref 44–121)
BUN/Creatinine Ratio: 17 (ref 9–23)
BUN: 12 mg/dL (ref 6–24)
Bilirubin Total: 0.4 mg/dL (ref 0.0–1.2)
CO2: 21 mmol/L (ref 20–29)
Calcium: 9 mg/dL (ref 8.7–10.2)
Chloride: 104 mmol/L (ref 96–106)
Creatinine, Ser: 0.71 mg/dL (ref 0.57–1.00)
Globulin, Total: 2.5 g/dL (ref 1.5–4.5)
Glucose: 107 mg/dL — ABNORMAL HIGH (ref 70–99)
Potassium: 4 mmol/L (ref 3.5–5.2)
Sodium: 141 mmol/L (ref 134–144)
Total Protein: 6.6 g/dL (ref 6.0–8.5)
eGFR: 100 mL/min/1.73

## 2023-05-23 LAB — LIPID PANEL
Chol/HDL Ratio: 2.8 ratio (ref 0.0–4.4)
Cholesterol, Total: 151 mg/dL (ref 100–199)
HDL: 54 mg/dL
LDL Chol Calc (NIH): 81 mg/dL (ref 0–99)
Triglycerides: 86 mg/dL (ref 0–149)
VLDL Cholesterol Cal: 16 mg/dL (ref 5–40)

## 2023-05-25 ENCOUNTER — Telehealth: Payer: Self-pay

## 2023-05-25 DIAGNOSIS — E782 Mixed hyperlipidemia: Secondary | ICD-10-CM

## 2023-05-25 MED ORDER — ATORVASTATIN CALCIUM 40 MG PO TABS: 60.0000 mg | ORAL_TABLET | Freq: Every day | ORAL | 3 refills | Status: DC

## 2023-05-25 NOTE — Telephone Encounter (Signed)
Viewed in MyChart Routed to PCP  

## 2023-05-25 NOTE — Telephone Encounter (Signed)
-----   Message from Flossie Dibble sent at 05/24/2023  2:22 PM EDT ----- Labs look stable. Cholesterol is too high, it is 81 and is should be < 70, lets increase lipitor to 60 mg daily, so 1.5 tablets. Repeat FLP and LFTs in 8 weeks.

## 2023-06-04 ENCOUNTER — Ambulatory Visit: Payer: Medicaid Other | Attending: Cardiology

## 2023-06-04 DIAGNOSIS — I340 Nonrheumatic mitral (valve) insufficiency: Secondary | ICD-10-CM

## 2023-06-04 DIAGNOSIS — Z86718 Personal history of other venous thrombosis and embolism: Secondary | ICD-10-CM

## 2023-06-04 DIAGNOSIS — I10 Essential (primary) hypertension: Secondary | ICD-10-CM

## 2023-06-04 DIAGNOSIS — I2510 Atherosclerotic heart disease of native coronary artery without angina pectoris: Secondary | ICD-10-CM

## 2023-06-04 DIAGNOSIS — R0609 Other forms of dyspnea: Secondary | ICD-10-CM

## 2023-06-04 DIAGNOSIS — Z86711 Personal history of pulmonary embolism: Secondary | ICD-10-CM

## 2023-06-04 DIAGNOSIS — R0683 Snoring: Secondary | ICD-10-CM

## 2023-06-04 LAB — ECHOCARDIOGRAM COMPLETE
MV M vel: 5.1 m/s
MV Peak grad: 104 mmHg
Radius: 0.5 cm
S' Lateral: 4 cm

## 2023-06-05 ENCOUNTER — Encounter: Payer: Self-pay | Admitting: Cardiology

## 2023-06-17 ENCOUNTER — Other Ambulatory Visit: Payer: Self-pay

## 2023-06-19 ENCOUNTER — Encounter: Payer: Self-pay | Admitting: Cardiology

## 2023-06-19 ENCOUNTER — Ambulatory Visit: Payer: Medicaid Other | Attending: Cardiology | Admitting: Cardiology

## 2023-06-19 VITALS — BP 140/84 | HR 67 | Ht 68.0 in | Wt 265.0 lb

## 2023-06-19 DIAGNOSIS — Q2112 Patent foramen ovale: Secondary | ICD-10-CM

## 2023-06-19 DIAGNOSIS — I2510 Atherosclerotic heart disease of native coronary artery without angina pectoris: Secondary | ICD-10-CM | POA: Diagnosis not present

## 2023-06-19 DIAGNOSIS — Z86711 Personal history of pulmonary embolism: Secondary | ICD-10-CM

## 2023-06-19 DIAGNOSIS — Z86718 Personal history of other venous thrombosis and embolism: Secondary | ICD-10-CM | POA: Diagnosis not present

## 2023-06-19 DIAGNOSIS — I5032 Chronic diastolic (congestive) heart failure: Secondary | ICD-10-CM | POA: Insufficient documentation

## 2023-06-19 DIAGNOSIS — I10 Essential (primary) hypertension: Secondary | ICD-10-CM

## 2023-06-19 MED ORDER — LOSARTAN POTASSIUM 50 MG PO TABS: 50.0000 mg | ORAL_TABLET | Freq: Two times a day (BID) | ORAL | 3 refills | Status: AC

## 2023-06-19 MED ORDER — METOPROLOL SUCCINATE ER 200 MG PO TB24: 200.0000 mg | ORAL_TABLET | Freq: Every day | ORAL | 3 refills | Status: DC

## 2023-06-19 NOTE — Progress Notes (Signed)
Cardiology Office Note:    Date:  06/19/2023   ID:  Meagan Doyle, DOB 12-10-1965, MRN [id number]  PCP:  Audie Pinto, FNP  Cardiologist:  Gypsy Balsam, MD    Referring MD: Audie Pinto, FNP   Chief Complaint  Patient presents with   Follow-up    History of Present Illness:    Meagan Doyle is a 57 y.o. female   with past medical history significant pulmonary emboli and DVT.  Initial insult was after she got cholecystectomy done then later she got the surgery and again end up having pulmonary emboli.  Also at that time pulmonary infarct.  At that time she did have right ventricular strain pattern with pulmonary pressure 44 mmHg she was transferred to [facility] for more advanced therapy however that she did not require this and she did quite well After that she did well however because of financial issues she was taking Xarelto I will only every other day and eventually end up having another episode of DVT and PE she did have going to the hospital treated appropriately and call recovering quite nicely.  She also did see hematologist for hypercoagulopathy work-up which was negative. Recently she was complaining of having some shortness of breath fatigue and swollen legs, echocardiogram has been done which showed low normal ejection fraction and grade 2 diastolic dysfunctions, she was also found to have small PFO which is irrelevant.  As she was put on different diuretic she is on Demadex right now with quite good effect.  She said she feels better shortness of breath improved swelling of lower extremity improved.  Still complain of having some exertional shortness of breath.  Denies have any chest pain tightness squeezing pressure burning chest  Past Medical History:  Diagnosis Date   Abdominal pain 10/11/2015   Acute pulmonary embolism (HCC) 06/04/2021   Aortic atherosclerosis (HCC) 09/27/2016   Formatting of this note might be different from the original. Seen on chest  x-ray in 08/2016.   Bilateral lower extremity edema 06/14/2020   Chronic cough 03/08/2021   Chronic pain 11/25/2019   Coronary artery disease 2024   mild, non-obstructive per cCTA   DDD (degenerative disc disease), lumbar 10/24/2019   Diastolic dysfunction 2024   Dyspnea on exertion 12/29/2019   Gallstone 10/11/2015   GERD (gastroesophageal reflux disease)    Hiatal hernia 08/12/2016   High blood cholesterol    High risk medication use 08/13/2016   History of deep vein thrombosis (DVT) of lower extremity 05/13/2021   History of pulmonary embolism 12/29/2019   HTN (hypertension) 05/01/2020   Hyperlipemia 12/24/2016   Hypertensive crisis 05/01/2020   Iron deficiency anemia 08/12/2016   Lumbar facet arthropathy 10/24/2019   Malaise and fatigue 08/13/2016   Morbid obesity (HCC) 11/25/2019   Obesity (BMI 30-39.9) 08/13/2016   PFO (patent foramen ovale) 2024   Prediabetes 08/12/2016   Primary osteoarthritis of right knee 09/16/2019   Pulmonary embolism (HCC)    Pulmonary embolism and infarction (HCC) 11/25/2019   S/P arthroscopic surgery of right knee 03/13/2020   Tear of medial meniscus of right knee 08/26/2019   Vitamin B12 deficiency 08/21/2016    Past Surgical History:  Procedure Laterality Date   ABDOMINAL HYSTERECTOMY     CHOLECYSTECTOMY     KNEE ARTHROPLASTY      Current Medications: Current Meds  Medication Sig   albuterol (ACCUNEB) 0.63 MG/3ML nebulizer solution Take 3 mLs by nebulization every 4 (four) hours as needed for wheezing  or shortness of breath.   amLODipine (NORVASC) 5 MG tablet Take 1 tablet (5 mg total) by mouth daily.   atorvastatin (LIPITOR) 20 MG tablet Take 60 mg by mouth daily.   hydroxychloroquine (PLAQUENIL) 200 MG tablet Take 400 mg by mouth daily.   losartan (COZAAR) 50 MG tablet Take 50 mg by mouth daily.   metoprolol tartrate (LOPRESSOR) 50 MG tablet Take 1 tablet (50 mg total) by mouth 2 (two) times daily.   omeprazole (PRILOSEC OTC) 20 MG  tablet Take 20 mg by mouth daily.   potassium chloride (KLOR-CON) 10 MEQ tablet Take 1 tablet (10 mEq total) by mouth daily.   torsemide (DEMADEX) 20 MG tablet Take 1 tablet (20 mg total) by mouth 2 (two) times daily.   XARELTO 10 MG TABS tablet Take 10 mg by mouth daily.     Allergies:   Patient has no known allergies.   Social History   Socioeconomic History   Marital status: Married    Spouse name: Not on file   Number of children: Not on file   Years of education: Not on file   Highest education level: Not on file  Occupational History   Not on file  Tobacco Use   Smoking status: Former    Current packs/day: 0.00    Average packs/day: 1 pack/day for 15.0 years (15.0 ttl pk-yrs)    Types: Cigarettes    Start date: 82    Quit date: 2003    Years since quitting: 21.7   Smokeless tobacco: Never  Vaping Use   Vaping status: Never Used  Substance and Sexual Activity   Alcohol use: Not Currently   Drug use: Never   Sexual activity: Not on file  Other Topics Concern   Not on file  Social History Narrative   Not on file   Social Determinants of Health   Financial Resource Strain: Not on file  Food Insecurity: Low Risk  (06/18/2023)   Received from [facility]   Hunger Vital Sign    Worried About Running Out of Food in the Last Year: Never true    Ran Out of Food in the Last Year: Never true  Transportation Needs: No Transportation Needs (06/18/2023)   Received from [facility]    In the past 12 months, has lack of reliable transportation kept you from medical appointments, meetings, work or from getting things needed for daily living? : No  Physical Activity: Not on file  Stress: Not on file  Social Connections: Not on file     Family History: The patient's family history is negative for Clotting disorder. ROS:   Please see the history of present illness.    All 14 point review of systems negative except as described per history of present  illness  EKGs/Labs/Other Studies Reviewed:         Recent Labs: 03/27/2023: Hemoglobin 12.5; Magnesium 2.0; NT-Pro BNP 140; Platelets 311; TSH 2.010 05/22/2023: ALT 15; BUN 12; Creatinine, Ser 0.71; Potassium 4.0; Sodium 141  Recent Lipid Panel    Component Value Date/Time   CHOL 151 05/22/2023 0849   TRIG 86 05/22/2023 0849   HDL 54 05/22/2023 0849   CHOLHDL 2.8 05/22/2023 0849   CHOLHDL 3.6 11/27/2019 0216   VLDL 18 11/27/2019 0216   LDLCALC 81 05/22/2023 0849   LDLDIRECT 113 (H) 09/20/2021 1444    Physical Exam:    VS:  BP (!) 140/84 (BP Location: Left Arm, Patient Position: Sitting, Cuff Size:  Normal)   Pulse 67   Ht 5\' 8"  (1.727 m)   Wt 265 lb (120.2 kg)   SpO2 97%   BMI 40.29 kg/m     Wt Readings from Last 3 Encounters:  06/19/23 265 lb (120.2 kg)  05/08/23 261 lb (118.4 kg)  03/27/23 256 lb 9.6 oz (116.4 kg)     GEN:  Well nourished, well developed in no acute distress HEENT: Normal NECK: No JVD; No carotid bruits LYMPHATICS: No lymphadenopathy CARDIAC: RRR, no murmurs, no rubs, no gallops RESPIRATORY:  Clear to auscultation without rales, wheezing or rhonchi  ABDOMEN: Soft, non-tender, non-distended MUSCULOSKELETAL:  No edema; No deformity  SKIN: Warm and dry LOWER EXTREMITIES: no swelling NEUROLOGIC:  Alert and oriented x 3 PSYCHIATRIC:  Normal affect   ASSESSMENT:    1. History of deep vein thrombosis (DVT) of lower extremity   2. History of pulmonary embolism   3. Coronary artery disease involving native coronary artery of native heart without angina pectoris   4. PFO (patent foramen ovale)   5. Primary hypertension   6. Chronic diastolic (congestive) heart failure (HCC)    PLAN:    In order of problems listed above:  History of DVT PE on anticoagulation which continue. Coronary disease nonobstructive denies having any symptoms that would suggest reactivation of the problem. Diastolic congestive heart failure.  I will increase dose of her  losartan from 50 to 100 mg daily, Chem-7 will be done next week what I am trying to accomplish is to get better control her blood pressure and better control of her congestive heart failure. Essential hypertension still uncontrolled hopefully with higher dose of losartan will accomplish the goal. Supraventricular tachycardia: I will ask her to switch from twice daily metoprolol to once a day form.   Medication Adjustments/Labs and Tests Ordered: Current medicines are reviewed at length with the patient today.  Concerns regarding medicines are outlined above.  No orders of the defined types were placed in this encounter.  Medication changes: No orders of the defined types were placed in this encounter.   Signed, Georgeanna Lea, MD, [facility] 06/19/2023 9:05 AM    [facility]

## 2023-06-19 NOTE — Patient Instructions (Signed)
Medication Instructions:  Your physician has recommended you make the following change in your medication:   Stop Lopressor  Start Toprol XL 200 mg daily  Increase Losartan 50 mg twice daily  *If you need a refill on your cardiac medications before your next appointment, please call your pharmacy*   Lab Work: Your physician recommends that you return for lab work in: 1 week for BMP You need to have labs done when you are fasting.  You can come Monday through Friday 8:30 am to 12:00 pm and 1:15 to 4:30. You do not need to make an appointment as the order has already been placed.  If you have labs (blood work) drawn today and your tests are completely normal, you will receive your results only by: MyChart Message (if you have MyChart) OR A paper copy in the mail If you have any lab test that is abnormal or we need to change your treatment, we will call you to review the results.   Testing/Procedures: None ordered   Follow-Up: At [facility], you and your health needs are our priority.  As part of our continuing mission to provide you with exceptional heart care, we have created designated Provider Care Teams.  These Care Teams include your primary Cardiologist (physician) and Advanced Practice Providers (APPs -  Physician Assistants and Nurse Practitioners) who all work together to provide you with the care you need, when you need it.  We recommend signing up for the patient portal called "MyChart".  Sign up information is provided on this After Visit Summary.  MyChart is used to connect with patients for Virtual Visits (Telemedicine).  Patients are able to view lab/test results, encounter notes, upcoming appointments, etc.  Non-urgent messages can be sent to your provider as well.   To learn more about what you can do with MyChart, go to [email or web address].    Your next appointment:   1 month(s)  The format for your next appointment:   In Person  Provider:    Gypsy Balsam, MD or Wallis Bamberg, NP Rosalita Levan)    Other Instructions none  Important Information About Sugar

## 2023-06-19 NOTE — Addendum Note (Signed)
Addended by: Eleonore Chiquito on: 06/19/2023 09:13 AM   Modules accepted: Orders

## 2023-06-30 LAB — BASIC METABOLIC PANEL
BUN/Creatinine Ratio: 18 (ref 9–23)
BUN: 15 mg/dL (ref 6–24)
CO2: 22 mmol/L (ref 20–29)
Calcium: 9.3 mg/dL (ref 8.7–10.2)
Chloride: 106 mmol/L (ref 96–106)
Creatinine, Ser: 0.84 mg/dL (ref 0.57–1.00)
Glucose: 84 mg/dL (ref 70–99)
Potassium: 4 mmol/L (ref 3.5–5.2)
Sodium: 143 mmol/L (ref 134–144)
eGFR: 82 mL/min/{1.73_m2} (ref >59)

## 2023-07-17 ENCOUNTER — Encounter: Payer: Self-pay | Admitting: Cardiology

## 2023-07-20 ENCOUNTER — Ambulatory Visit: Payer: Medicaid Other | Attending: Cardiology | Admitting: Cardiology

## 2023-07-20 ENCOUNTER — Encounter: Payer: Self-pay | Admitting: Cardiology

## 2023-07-20 ENCOUNTER — Telehealth: Payer: Self-pay

## 2023-07-20 VITALS — BP 146/92 | HR 64 | Ht 69.0 in | Wt 267.8 lb

## 2023-07-20 DIAGNOSIS — I10 Essential (primary) hypertension: Secondary | ICD-10-CM | POA: Diagnosis not present

## 2023-07-20 DIAGNOSIS — Z86718 Personal history of other venous thrombosis and embolism: Secondary | ICD-10-CM

## 2023-07-20 DIAGNOSIS — Z86711 Personal history of pulmonary embolism: Secondary | ICD-10-CM

## 2023-07-20 DIAGNOSIS — Q2112 Patent foramen ovale: Secondary | ICD-10-CM

## 2023-07-20 DIAGNOSIS — E782 Mixed hyperlipidemia: Secondary | ICD-10-CM

## 2023-07-20 MED ORDER — RIVAROXABAN 20 MG PO TABS: 20.0000 mg | ORAL_TABLET | Freq: Every day | ORAL | 3 refills | Status: DC

## 2023-07-20 NOTE — Telephone Encounter (Addendum)
Patient agreement reviewed and signed on 07/20/2023.  WatchPAT issued to patient on 07/20/2023 by Neena Rhymes, RN. Patient aware to not open the WatchPAT box until contacted with the activation PIN. Patient profile initialized in CloudPAT on 07/20/2023 by Baldo Ash, RN. Device serial number: Pt has machine - [id number] ordered 05-08-23 by Festus Barren  Please list Reason for Call as Advice Only and type "WatchPAT issued to patient" in the comment box.

## 2023-07-20 NOTE — Addendum Note (Signed)
Addended by: Baldo Ash D on: 07/20/2023 11:32 AM   Modules accepted: Orders

## 2023-07-20 NOTE — Patient Instructions (Addendum)
Medication Instructions:   INCREASE: Xarelto to 20mg  1 tablet daily   Lab Work: [employer]- today If you have labs (blood work) drawn today and your tests are completely normal, you will receive your results only by: MyChart Message (if you have MyChart) OR A paper copy in the mail If you have any lab test that is abnormal or we need to change your treatment, we will call you to review the results.   Testing/Procedures: None Ordered   Follow-Up: At [facility], you and your health needs are our priority.  As part of our continuing mission to provide you with exceptional heart care, we have created designated Provider Care Teams.  These Care Teams include your primary Cardiologist (physician) and Advanced Practice Providers (APPs -  Physician Assistants and Nurse Practitioners) who all work together to provide you with the care you need, when you need it.  We recommend signing up for the patient portal called "MyChart".  Sign up information is provided on this After Visit Summary.  MyChart is used to connect with patients for Virtual Visits (Telemedicine).  Patients are able to view lab/test results, encounter notes, upcoming appointments, etc.  Non-urgent messages can be sent to your provider as well.   To learn more about what you can do with MyChart, go to [email or web address].    Your next appointment:   5 month(s)  The format for your next appointment:   In Person  Provider:   Gypsy Balsam, MD    Other Instructions NA

## 2023-07-20 NOTE — Progress Notes (Addendum)
Cardiology Office Note:    Date:  07/20/2023   ID:  Meagan Doyle, DOB Aug 12, 1966, MRN [id number]  PCP:  Audie Pinto, FNP  Cardiologist:  Gypsy Balsam, MD    Referring MD: Audie Pinto, FNP   Chief Complaint  Patient presents with   Follow-up    History of Present Illness:    Meagan Doyle is a 57 y.o. female with past medical history significant for pulmonary emboli, DVT first event happened after she got cholecystectomy at that time she also suffer from pulmonary infarct she did have right ventricle strain pattern pulmonary pressure 44 mmHg, she was transferred to [facility] for more advanced therapy however eventually she ended up not getting it she did require.  After that she was trying to save some money on Xarelto and taking every other day and of having another DVT and PE then she did see hematologist for hypercoagulable workup which apparently was negative.  Comes today to months for follow-up.  She was complaining of having swollen legs and shortness of breath.  We intensified diuresis and things start looking better.  She does have echocardiogram done which showed preserved ejection fraction small PFO which is irrelevant.  The issue that she is struggling with his hypertension. Comes today to my office for follow-up doing well denies having any chest pain tightness squeezing pressure burning chest shortness of breath seems to well-controlled  Past Medical History:  Diagnosis Date   Abdominal pain 10/11/2015   Acute pulmonary embolism (HCC) 06/04/2021   Aortic atherosclerosis (HCC) 09/27/2016   Formatting of this note might be different from the original. Seen on chest x-ray in 08/2016.   Bilateral lower extremity edema 06/14/2020   Chronic cough 03/08/2021   Chronic pain 11/25/2019   Coronary artery disease 2024   mild, non-obstructive per cCTA   DDD (degenerative disc disease), lumbar 10/24/2019   Diastolic dysfunction 2024   Dyspnea on exertion  12/29/2019   Gallstone 10/11/2015   GERD (gastroesophageal reflux disease)    Hiatal hernia 08/12/2016   High blood cholesterol    High risk medication use 08/13/2016   History of deep vein thrombosis (DVT) of lower extremity 05/13/2021   History of pulmonary embolism 12/29/2019   HTN (hypertension) 05/01/2020   Hyperlipemia 12/24/2016   Hypertensive crisis 05/01/2020   Iron deficiency anemia 08/12/2016   Lumbar facet arthropathy 10/24/2019   Malaise and fatigue 08/13/2016   Morbid obesity (HCC) 11/25/2019   Obesity (BMI 30-39.9) 08/13/2016   PFO (patent foramen ovale) 2024   Prediabetes 08/12/2016   Primary osteoarthritis of right knee 09/16/2019   Pulmonary embolism (HCC)    Pulmonary embolism and infarction (HCC) 11/25/2019   S/P arthroscopic surgery of right knee 03/13/2020   Tear of medial meniscus of right knee 08/26/2019   Vitamin B12 deficiency 08/21/2016    Past Surgical History:  Procedure Laterality Date   ABDOMINAL HYSTERECTOMY     CHOLECYSTECTOMY     KNEE ARTHROPLASTY      Current Medications: Current Meds  Medication Sig   albuterol (ACCUNEB) 0.63 MG/3ML nebulizer solution Take 3 mLs by nebulization every 4 (four) hours as needed for wheezing or shortness of breath.   amLODipine (NORVASC) 5 MG tablet Take 1 tablet (5 mg total) by mouth daily.   atorvastatin (LIPITOR) 20 MG tablet Take 60 mg by mouth daily.   hydroxychloroquine (PLAQUENIL) 200 MG tablet Take 400 mg by mouth daily.   losartan (COZAAR) 50 MG tablet Take 1 tablet (50  mg total) by mouth 2 (two) times daily.   metoprolol (TOPROL-XL) 200 MG 24 hr tablet Take 1 tablet (200 mg total) by mouth daily.   omeprazole (PRILOSEC OTC) 20 MG tablet Take 20 mg by mouth daily.   potassium chloride (KLOR-CON) 10 MEQ tablet Take 1 tablet (10 mEq total) by mouth daily.   torsemide (DEMADEX) 20 MG tablet Take 1 tablet (20 mg total) by mouth 2 (two) times daily.   XARELTO 10 MG TABS tablet Take 10 mg by mouth daily.      Allergies:   Patient has no known allergies.   Social History   Socioeconomic History   Marital status: Married    Spouse name: Not on file   Number of children: Not on file   Years of education: Not on file   Highest education level: Not on file  Occupational History   Not on file  Tobacco Use   Smoking status: Former    Current packs/day: 0.00    Average packs/day: 1 pack/day for 15.0 years (15.0 ttl pk-yrs)    Types: Cigarettes    Start date: 61    Quit date: 2003    Years since quitting: 21.8   Smokeless tobacco: Never  Vaping Use   Vaping status: Never Used  Substance and Sexual Activity   Alcohol use: Not Currently   Drug use: Never   Sexual activity: Not on file  Other Topics Concern   Not on file  Social History Narrative   Not on file   Social Determinants of Health   Financial Resource Strain: Not on file  Food Insecurity: Low Risk  (06/18/2023)   Received from [facility]   Hunger Vital Sign    Worried About Running Out of Food in the Last Year: Never true    Ran Out of Food in the Last Year: Never true  Transportation Needs: No Transportation Needs (06/18/2023)   Received from [facility]    In the past 12 months, has lack of reliable transportation kept you from medical appointments, meetings, work or from getting things needed for daily living? : No  Physical Activity: Not on file  Stress: Not on file  Social Connections: Not on file     Family History: The patient's family history is negative for Clotting disorder. ROS:   Please see the history of present illness.    All 14 point review of systems negative except as described per history of present illness  EKGs/Labs/Other Studies Reviewed:         Recent Labs: 03/27/2023: Hemoglobin 12.5; Magnesium 2.0; NT-Pro BNP 140; Platelets 311; TSH 2.010 05/22/2023: ALT 15 06/29/2023: BUN 15; Creatinine, Ser 0.84; Potassium 4.0; Sodium 143  Recent Lipid Panel    Component  Value Date/Time   CHOL 151 05/22/2023 0849   TRIG 86 05/22/2023 0849   HDL 54 05/22/2023 0849   CHOLHDL 2.8 05/22/2023 0849   CHOLHDL 3.6 11/27/2019 0216   VLDL 18 11/27/2019 0216   LDLCALC 81 05/22/2023 0849   LDLDIRECT 113 (H) 09/20/2021 1444    Physical Exam:    VS:  BP (!) 146/92 (BP Location: Left Arm, Patient Position: Sitting)   Pulse 64   Ht 5\' 9"  (1.753 m)   Wt 267 lb 12.8 oz (121.5 kg)   SpO2 93%   BMI 39.55 kg/m     Wt Readings from Last 3 Encounters:  07/20/23 267 lb 12.8 oz (121.5 kg)  06/19/23 265 lb (120.2 kg)  05/08/23 261 lb (118.4 kg)     GEN:  Well nourished, well developed in no acute distress HEENT: Normal NECK: No JVD; No carotid bruits LYMPHATICS: No lymphadenopathy CARDIAC: RRR, no murmurs, no rubs, no gallops RESPIRATORY:  Clear to auscultation without rales, wheezing or rhonchi  ABDOMEN: Soft, non-tender, non-distended MUSCULOSKELETAL:  No edema; No deformity  SKIN: Warm and dry LOWER EXTREMITIES: no swelling NEUROLOGIC:  Alert and oriented x 3 PSYCHIATRIC:  Normal affect   ASSESSMENT:    1. Primary hypertension   2. PFO (patent foramen ovale)   3. History of deep vein thrombosis (DVT) of lower extremity   4. History of pulmonary embolism   5. Mixed hyperlipidemia    PLAN:    In order of problems listed above:  Essential hypertension: Blood pressure poorly controlled we will get Chem-7 if Chem-7 is fine we will add Aldactone 12.5 daily. PFO in relevance. History of DVT PE, she is anticoagulated with Xarelto however I see does only 10 mg I will try to investigate in my opinion she should benefit from 20 mg especially since previously she had recurrent episode of DVT/PE while taking Xarelto every other day.  She does see hematologist I need to review note which I will do in the moment. Mixed dyslipidemia, I did review K PN show LDL 81 HDL 54 we will continue present management I did review note for this patient.  Looks like automatically  her switch from Lovenox to 10 mg of Xarelto.  I would consider her clinical scenario as high risk.  Lady already got DVT and PE twice.  On top of that she got PFO.  We definitely do not want to have another episode of PVD/DVT.  Therefore I will go on full dose of Xarelto which will be 20 mg daily.  I asked her to let me know if she get any negative effect of this including bleeding   Medication Adjustments/Labs and Tests Ordered: Current medicines are reviewed at length with the patient today.  Concerns regarding medicines are outlined above.  No orders of the defined types were placed in this encounter.  Medication changes: No orders of the defined types were placed in this encounter.   Signed, Georgeanna Lea, MD, [facility] 07/20/2023 9:45 AM    [facility]

## 2023-07-20 NOTE — Addendum Note (Signed)
Addended by: Baldo Ash D on: 07/20/2023 09:55 AM   Modules accepted: Orders

## 2023-07-21 LAB — BASIC METABOLIC PANEL
BUN/Creatinine Ratio: 21 (ref 9–23)
BUN: 15 mg/dL (ref 6–24)
CO2: 24 mmol/L (ref 20–29)
Calcium: 9.1 mg/dL (ref 8.7–10.2)
Chloride: 109 mmol/L — ABNORMAL HIGH (ref 96–106)
Creatinine, Ser: 0.72 mg/dL (ref 0.57–1.00)
Glucose: 90 mg/dL (ref 70–99)
Potassium: 4.1 mmol/L (ref 3.5–5.2)
Sodium: 146 mmol/L — ABNORMAL HIGH (ref 134–144)
eGFR: 98 mL/min/{1.73_m2} (ref >59)

## 2023-08-10 ENCOUNTER — Telehealth: Payer: Self-pay

## 2023-08-10 NOTE — Telephone Encounter (Signed)
**Note De-Identified Anabelle Bungert Obfuscation** Ordering provider: Wallis Bamberg, NP Associated diagnoses: Snoring-R06.83 and Fatigue-R53.83 WatchPAT PA obtained on 08/10/2023 by Keirstin Musil, Lorelle Formosa, LPN, Per the [facility] Provider Portal: Your case has been Approved. The prior authorization you submitted, Case [id number], valid dates: 08/10/2023-11/08/2023.  Patient notified of PIN (1234) on 08/10/2023 Meagan Doyle Notification Method: phone , no answer so I left a detailed message on her VM (Ok per [facility]) and I sent her a [facility] message as well.  Phone note routed to covering staff for follow-up.

## 2023-08-24 ENCOUNTER — Encounter (INDEPENDENT_AMBULATORY_CARE_PROVIDER_SITE_OTHER): Payer: Medicaid Other | Admitting: Cardiology

## 2023-08-24 DIAGNOSIS — G4733 Obstructive sleep apnea (adult) (pediatric): Secondary | ICD-10-CM | POA: Diagnosis not present

## 2023-08-25 ENCOUNTER — Ambulatory Visit: Payer: Medicaid Other | Attending: Cardiology

## 2023-08-25 DIAGNOSIS — Z86711 Personal history of pulmonary embolism: Secondary | ICD-10-CM

## 2023-08-25 DIAGNOSIS — R0609 Other forms of dyspnea: Secondary | ICD-10-CM

## 2023-08-25 DIAGNOSIS — I2510 Atherosclerotic heart disease of native coronary artery without angina pectoris: Secondary | ICD-10-CM

## 2023-08-25 DIAGNOSIS — I340 Nonrheumatic mitral (valve) insufficiency: Secondary | ICD-10-CM

## 2023-08-25 DIAGNOSIS — Z86718 Personal history of other venous thrombosis and embolism: Secondary | ICD-10-CM

## 2023-08-25 DIAGNOSIS — R0683 Snoring: Secondary | ICD-10-CM

## 2023-08-25 DIAGNOSIS — I10 Essential (primary) hypertension: Secondary | ICD-10-CM

## 2023-08-25 NOTE — Procedures (Signed)
SLEEP STUDY REPORT Patient Information Study Date: 08/24/2023 Patient Name: Meagan Doyle Patient ID: [id number] Birth Date: 23-Jan-1966 Age: 57 Gender: Female BMI: 39.5 (W=267 lb, H=5' 9'') Referring Physician: Gypsy Balsam, MD  TEST DESCRIPTION: Home sleep apnea testing was completed using the WatchPat, a Type 1 device, utilizing  peripheral arterial tonometry (PAT), chest movement, actigraphy, pulse oximetry, pulse rate, body position and snore.  AHI was calculated with apnea and hypopnea using valid sleep time as the denominator. RDI includes apneas,  hypopneas, and RERAs. The data acquired and the scoring of sleep and all associated events were performed in  accordance with the recommended standards and specifications as outlined in the AASM Manual for the Scoring of  Sleep and Associated Events 2.2.0 (2015).   FINDINGS:   1. Mild Obstructive Sleep Apnea with AHI 13.5/hr. overall and moderate during REM sleep with REM AHI 18.8/hr.  2. No Central Sleep Apnea with pAHIc 0.1/hr.   3. Oxygen desaturations as low as 84%.   4. Mild snoring was present. O2 sats were < 88% for 6.2 min.   5. Total sleep time was 7 hrs and 3 min.   6. 19.1% of total sleep time was spent in REM sleep.   7. Shortened sleep onset latency at 5 min.   8. Prolonged REM sleep onset latency at 128 min.   9. Total awakenings were 8.  10. Arrhythmia detection: None  DIAGNOSIS: Mild Obstructive Sleep Apnea (G47.33) overall; and Moderate REM Obstructive Sleep Apnea Nocturnal Hypoxemia  RECOMMENDATIONS: 1. Clinical correlation of these findings is necessary. The decision to treat obstructive sleep apnea (OSA) is usually  based on the presence of apnea symptoms or the presence of associated medical conditions such as Hypertension,  Congestive Heart Failure, Atrial Fibrillation or Obesity. The most common symptoms of OSA are snoring, gasping for  breath while sleeping, daytime sleepiness and fatigue.   2.  Initiating apnea therapy is recommended given the presence of symptoms and/or associated conditions.  Recommend proceeding with one of the following:   a. Auto-CPAP therapy with a pressure range of 5-20cm H2O.   b. An oral appliance (OA) that can be obtained from certain dentists with expertise in sleep medicine. These are  primarily of use in non-obese patients with mild and moderate disease.   c. An ENT consultation which may be useful to look for specific causes of obstruction and possible treatment  options.   d. If patient is intolerant to PAP therapy, consider referral to ENT for evaluation for hypoglossal nerve stimulator.   3. Close follow-up is necessary to ensure success with CPAP or oral appliance therapy for maximum benefit .  4. A follow-up oximetry study on CPAP is recommended to assess the adequacy of therapy and determine the need  for supplemental oxygen or the potential need for Bi-level therapy. An arterial blood gas to determine the adequacy of  baseline ventilation and oxygenation should also be considered.  5. Healthy sleep recommendations include: adequate nightly sleep (normal 7-9 hrs/night), avoidance of caffeine after  noon and alcohol near bedtime, and maintaining a sleep environment that is cool, dark and quiet.  6. Weight loss for overweight patients is recommended. Even modest amounts of weight loss can significantly  improve the severity of sleep apnea.  7. Snoring recommendations include: weight loss where appropriate, side sleeping, and avoidance of alcohol before  bed.  8. Operation of motor vehicle should be avoided when sleepy.  Signature: Armanda Magic, MD; [facility]; Diplomat, American Board of  Sleep  Medicine Electronically Signed: 08/25/2023 6:25:55 PM

## 2023-08-26 ENCOUNTER — Telehealth: Payer: Self-pay

## 2023-08-26 DIAGNOSIS — G4733 Obstructive sleep apnea (adult) (pediatric): Secondary | ICD-10-CM

## 2023-08-26 NOTE — Telephone Encounter (Signed)
Pt called in returning Rorie phone call.    Best number [phone]

## 2023-08-26 NOTE — Telephone Encounter (Signed)
Left VM with callback number for patient to receive sleep study results and recommendations.

## 2023-08-26 NOTE — Telephone Encounter (Signed)
-----   Message from Armanda Magic sent at 08/25/2023  6:27 PM EST ----- Please let patient know that they have sleep apnea and recommend treating with CPAP.  Please order an auto CPAP from 4-15cm H2O with heated humidity and mask of choice.  Order overnight pulse ox on CPAP.  Followup with me in 6 weeks.

## 2023-09-07 NOTE — Addendum Note (Signed)
Addended by: Brunetta Genera on: 09/07/2023 10:05 AM   Modules accepted: Orders

## 2023-09-07 NOTE — Telephone Encounter (Signed)
 Left VM with callback number for patient to receive sleep study results and recommendations.

## 2023-09-07 NOTE — Telephone Encounter (Signed)
Patient returned call. Patient notified of sleep study results and recommendations. All questions were answered and patient verbalized understanding. CPAP order placed today with [facility].

## 2023-11-02 ENCOUNTER — Telehealth: Payer: Self-pay | Admitting: Cardiology

## 2023-11-02 NOTE — Telephone Encounter (Signed)
Patient states that she completed a oximetry study and turned it into [facility] about 2 weeks ago and wants to know if Dr. Mayford Knife has had time to review it. Please advise.

## 2023-11-13 ENCOUNTER — Telehealth: Payer: Self-pay | Admitting: Cardiology

## 2023-11-13 NOTE — Telephone Encounter (Signed)
Patient stated she received her sleep study test results and wants a call back to discuss the results.

## 2023-11-16 NOTE — Telephone Encounter (Signed)
Per Dr Mayford Knife, Normal ONO on CPAP - no O2 needed   The patient has been notified of the result via her voicemail. [email or web address], CMA 11/16/2023 4:25 PM

## 2023-12-22 ENCOUNTER — Ambulatory Visit: Payer: Medicaid Other | Attending: Cardiology | Admitting: Cardiology

## 2023-12-22 ENCOUNTER — Encounter: Payer: Self-pay | Admitting: Cardiology

## 2023-12-22 VITALS — BP 128/84 | HR 61 | Ht 68.0 in | Wt 275.0 lb

## 2023-12-22 DIAGNOSIS — R002 Palpitations: Secondary | ICD-10-CM

## 2023-12-22 DIAGNOSIS — Z86711 Personal history of pulmonary embolism: Secondary | ICD-10-CM

## 2023-12-22 DIAGNOSIS — R0609 Other forms of dyspnea: Secondary | ICD-10-CM

## 2023-12-22 DIAGNOSIS — I2510 Atherosclerotic heart disease of native coronary artery without angina pectoris: Secondary | ICD-10-CM

## 2023-12-22 DIAGNOSIS — Q2112 Patent foramen ovale: Secondary | ICD-10-CM

## 2023-12-22 DIAGNOSIS — I10 Essential (primary) hypertension: Secondary | ICD-10-CM | POA: Diagnosis not present

## 2023-12-22 DIAGNOSIS — G8929 Other chronic pain: Secondary | ICD-10-CM

## 2023-12-22 DIAGNOSIS — R7303 Prediabetes: Secondary | ICD-10-CM

## 2023-12-22 NOTE — Progress Notes (Signed)
 Cardiology Office Note:    Date:  12/22/2023   ID:  Meagan Doyle, DOB 21-Oct-1965, MRN [id number]  PCP:  Audie Pinto, FNP  Cardiologist:  Gypsy Balsam, MD    Referring MD: Audie Pinto, FNP   No chief complaint on file.   History of Present Illness:    Meagan Doyle is a 58 y.o. female past medical history significant for pulmonary, DVT first event happened after she got cholecystectomy she suffered from pulmonary infarct she did have right ventricular strain pattern, pulmonary infarct as well pulmonary pressure 44, she was transferred to [facility] with intention of more advanced therapy however eventually she did not require did not recover quite well she was on Xarelto but after that she stopped it then she never had another pulmonary emboli and DVT comes today for follow-up after that.  She is doing well except for the fact she does have still painful swollen heavy legs.  Echocardiogram showed preserved ejection fraction, she does have small PFO otherwise she complained of having pains in multiple areas legs worse.  Past Medical History:  Diagnosis Date   Abdominal pain 10/11/2015   Acute pulmonary embolism (HCC) 06/04/2021   Aortic atherosclerosis (HCC) 09/27/2016   Formatting of this note might be different from the original. Seen on chest x-ray in 08/2016.   Bilateral lower extremity edema 06/14/2020   Chronic cough 03/08/2021   Chronic pain 11/25/2019   Coronary artery disease 2024   mild, non-obstructive per cCTA   DDD (degenerative disc disease), lumbar 10/24/2019   Diastolic dysfunction 2024   Dyspnea on exertion 12/29/2019   Gallstone 10/11/2015   GERD (gastroesophageal reflux disease)    Hiatal hernia 08/12/2016   High blood cholesterol    High risk medication use 08/13/2016   History of deep vein thrombosis (DVT) of lower extremity 05/13/2021   History of pulmonary embolism 12/29/2019   HTN (hypertension) 05/01/2020   Hyperlipemia 12/24/2016    Hypertensive crisis 05/01/2020   Iron deficiency anemia 08/12/2016   Lumbar facet arthropathy 10/24/2019   Malaise and fatigue 08/13/2016   Morbid obesity (HCC) 11/25/2019   Obesity (BMI 30-39.9) 08/13/2016   PFO (patent foramen ovale) 2024   Prediabetes 08/12/2016   Primary osteoarthritis of right knee 09/16/2019   Pulmonary embolism (HCC)    Pulmonary embolism and infarction (HCC) 11/25/2019   S/P arthroscopic surgery of right knee 03/13/2020   Tear of medial meniscus of right knee 08/26/2019   Vitamin B12 deficiency 08/21/2016    Past Surgical History:  Procedure Laterality Date   ABDOMINAL HYSTERECTOMY     CHOLECYSTECTOMY     KNEE ARTHROPLASTY      Current Medications: Current Meds  Medication Sig   albuterol (ACCUNEB) 0.63 MG/3ML nebulizer solution Take 3 mLs by nebulization every 4 (four) hours as needed for wheezing or shortness of breath.   amLODipine (NORVASC) 5 MG tablet Take 1 tablet (5 mg total) by mouth daily.   atorvastatin (LIPITOR) 20 MG tablet Take 60 mg by mouth daily.   hydroxychloroquine (PLAQUENIL) 200 MG tablet Take 400 mg by mouth daily.   LINZESS 145 MCG CAPS capsule Take 145 mcg by mouth daily.   losartan (COZAAR) 50 MG tablet Take 1 tablet (50 mg total) by mouth 2 (two) times daily.   metoprolol (TOPROL-XL) 200 MG 24 hr tablet Take 1 tablet (200 mg total) by mouth daily.   omeprazole (PRILOSEC OTC) 20 MG tablet Take 20 mg by mouth daily.   omeprazole (PRILOSEC)  20 MG capsule Take 20 mg by mouth every morning.   potassium chloride (KLOR-CON) 10 MEQ tablet Take 1 tablet (10 mEq total) by mouth daily.   rivaroxaban (XARELTO) 20 MG TABS tablet Take 1 tablet (20 mg total) by mouth daily with supper.   torsemide (DEMADEX) 20 MG tablet Take 1 tablet (20 mg total) by mouth 2 (two) times daily.     Allergies:   Patient has no known allergies.   Social History   Socioeconomic History   Marital status: Married    Spouse name: Not on file   Number of  children: Not on file   Years of education: Not on file   Highest education level: Not on file  Occupational History   Not on file  Tobacco Use   Smoking status: Former    Current packs/day: 0.00    Average packs/day: 1 pack/day for 15.0 years (15.0 ttl pk-yrs)    Types: Cigarettes    Start date: 40    Quit date: 2003    Years since quitting: 22.2   Smokeless tobacco: Never  Vaping Use   Vaping status: Never Used  Substance and Sexual Activity   Alcohol use: Not Currently   Drug use: Never   Sexual activity: Not on file  Other Topics Concern   Not on file  Social History Narrative   Not on file   Social Drivers of Health   Financial Resource Strain: Not on file  Food Insecurity: Low Risk  (12/17/2023)   Received from [facility]   Hunger Vital Sign    Worried About Running Out of Food in the Last Year: Never true    Ran Out of Food in the Last Year: Never true  Transportation Needs: No Transportation Needs (12/17/2023)   Received from [facility]    In the past 12 months, has lack of reliable transportation kept you from medical appointments, meetings, work or from getting things needed for daily living? : No  Physical Activity: Not on file  Stress: Not on file  Social Connections: Not on file     Family History: The patient's family history is negative for Clotting disorder. ROS:   Please see the history of present illness.    All 14 point review of systems negative except as described per history of present illness  EKGs/Labs/Other Studies Reviewed:    EKG Interpretation Date/Time:  Tuesday December 22 2023 13:13:33 EDT Ventricular Rate:  61 PR Interval:  180 QRS Duration:  94 QT Interval:  456 QTC Calculation: 459 R Axis:   35  Text Interpretation: Normal sinus rhythm Cannot rule out Anterior infarct , age undetermined When compared with ECG of 04-Jun-2021 17:49, No significant change was found Confirmed by Gypsy Balsam [id number]) on  12/22/2023 1:24:03 PM    Recent Labs: 03/27/2023: Hemoglobin 12.5; Magnesium 2.0; NT-Pro BNP 140; Platelets 311; TSH 2.010 05/22/2023: ALT 15 07/20/2023: BUN 15; Creatinine, Ser 0.72; Potassium 4.1; Sodium 146  Recent Lipid Panel    Component Value Date/Time   CHOL 151 05/22/2023 0849   TRIG 86 05/22/2023 0849   HDL 54 05/22/2023 0849   CHOLHDL 2.8 05/22/2023 0849   CHOLHDL 3.6 11/27/2019 0216   VLDL 18 11/27/2019 0216   LDLCALC 81 05/22/2023 0849   LDLDIRECT 113 (H) 09/20/2021 1444    Physical Exam:    VS:  BP 128/84   Pulse 61   Ht 5\' 8"  (1.727 m)   Wt 275 lb (124.7 kg)  SpO2 93%   BMI 41.81 kg/m     Wt Readings from Last 3 Encounters:  12/22/23 275 lb (124.7 kg)  07/20/23 267 lb 12.8 oz (121.5 kg)  06/19/23 265 lb (120.2 kg)     GEN:  Well nourished, well developed in no acute distress HEENT: Normal NECK: No JVD; No carotid bruits LYMPHATICS: No lymphadenopathy CARDIAC: RRR, no murmurs, no rubs, no gallops RESPIRATORY:  Clear to auscultation without rales, wheezing or rhonchi  ABDOMEN: Soft, non-tender, non-distended MUSCULOSKELETAL:  No edema; No deformity  SKIN: Warm and dry LOWER EXTREMITIES: 1+ swelling NEUROLOGIC:  Alert and oriented x 3 PSYCHIATRIC:  Normal affect   ASSESSMENT:    1. Essential hypertension   2. Palpitations   3. History of pulmonary embolism   4. Dyspnea on exertion   5. Other chronic pain   6. Prediabetes   7. Coronary artery disease involving native coronary artery of native heart without angina pectoris   8. Primary hypertension   9. PFO (patent foramen ovale)    PLAN:    In order of problems listed above:  History of pulmonary emboli, anticoagulated full anticoagulation 20 mg of Xarelto which I will continue. Essential hypertension: Blood pressure well-controlled continue present management. Dyslipidemia I did review K PN show me LDL 81 HDL 54 continue present management. PFO small and relevant. Diabetes followed by  internal medicine team. Pain in her legs: There is 1 swelling still present check Chem-7 if Chem-7 is fine we will try to maximize her diuretics and see if it helps   Medication Adjustments/Labs and Tests Ordered: Current medicines are reviewed at length with the patient today.  Concerns regarding medicines are outlined above.  Orders Placed This Encounter  Procedures   EKG 12-Lead   Medication changes: No orders of the defined types were placed in this encounter.   Signed, Georgeanna Lea, MD, [facility] 12/22/2023 1:35 PM    [facility]

## 2023-12-22 NOTE — Addendum Note (Signed)
 Addended by: Baldo Ash D on: 12/22/2023 01:57 PM   Modules accepted: Orders

## 2023-12-22 NOTE — Patient Instructions (Signed)
Medication Instructions:  Your physician recommends that you continue on your current medications as directed. Please refer to the Current Medication list given to you today.  *If you need a refill on your cardiac medications before your next appointment, please call your pharmacy*   Lab Work: BMP, ProBNP - today If you have labs (blood work) drawn today and your tests are completely normal, you will receive your results only by: [place] (if you have MyChart) OR A paper copy in the mail If you have any lab test that is abnormal or we need to change your treatment, we will call you to review the results.   Testing/Procedures: None Ordered   Follow-Up: At [facility], you and your health needs are our priority.  As part of our continuing mission to provide you with exceptional heart care, we have created designated Provider Care Teams.  These Care Teams include your primary Cardiologist (physician) and Advanced Practice Providers (APPs -  Physician Assistants and Nurse Practitioners) who all work together to provide you with the care you need, when you need it.  We recommend signing up for the patient portal called "MyChart".  Sign up information is provided on this After Visit Summary.  MyChart is used to connect with patients for Virtual Visits (Telemedicine).  Patients are able to view lab/test results, encounter notes, upcoming appointments, etc.  Non-urgent messages can be sent to your provider as well.   To learn more about what you can do with MyChart, go to [email or web address].    Your next appointment:   5 month(s)  The format for your next appointment:   In Person  Provider:   Jenne Campus, MD    Other Instructions NA

## 2023-12-23 LAB — BASIC METABOLIC PANEL
BUN/Creatinine Ratio: 18 (ref 9–23)
BUN: 15 mg/dL (ref 6–24)
CO2: 22 mmol/L (ref 20–29)
Calcium: 9.5 mg/dL (ref 8.7–10.2)
Chloride: 103 mmol/L (ref 96–106)
Creatinine, Ser: 0.83 mg/dL (ref 0.57–1.00)
Glucose: 84 mg/dL (ref 70–99)
Potassium: 5.1 mmol/L (ref 3.5–5.2)
Sodium: 141 mmol/L (ref 134–144)
eGFR: 82 mL/min/{1.73_m2} (ref >59)

## 2023-12-23 LAB — PRO B NATRIURETIC PEPTIDE: NT-Pro BNP: 156 pg/mL (ref 0–287)

## 2023-12-30 ENCOUNTER — Telehealth: Payer: Self-pay

## 2023-12-30 ENCOUNTER — Encounter: Payer: Self-pay | Admitting: Cardiology

## 2023-12-30 ENCOUNTER — Ambulatory Visit: Payer: Medicaid Other | Attending: Cardiology | Admitting: Cardiology

## 2023-12-30 VITALS — BP 120/82 | HR 62 | Resp 16 | Ht 68.0 in | Wt 276.0 lb

## 2023-12-30 DIAGNOSIS — G4733 Obstructive sleep apnea (adult) (pediatric): Secondary | ICD-10-CM | POA: Diagnosis not present

## 2023-12-30 DIAGNOSIS — I10 Essential (primary) hypertension: Secondary | ICD-10-CM

## 2023-12-30 MED ORDER — MELATONIN 5 MG PO TABS: 5.0000 mg | ORAL_TABLET | Freq: Every evening | ORAL | Status: DC | PRN

## 2023-12-30 NOTE — Telephone Encounter (Signed)
 Left message on My Chart with lab results per Dr. Vanetta Shawl note. Routed to PCP.

## 2023-12-30 NOTE — Telephone Encounter (Signed)
 Patient stated she was returning RN's call regarding results.

## 2023-12-30 NOTE — Telephone Encounter (Signed)
 Pt viewed lab results on My Chart per Dr. Vanetta Shawl note. Routed to PCP.

## 2023-12-30 NOTE — Patient Instructions (Signed)
 Medication Instructions:  Your physician recommends that you continue on your current medications as directed. Please refer to the Current Medication list given to you today.  Dr. Mayford Knife recommended trying over the counter melatonin 5 mg nightly as needed for sleep.  *If you need a refill on your cardiac medications before your next appointment, please call your pharmacy*   Follow-Up: At [facility], you and your health needs are our priority.  As part of our continuing mission to provide you with exceptional heart care, we have created designated Provider Care Teams.  These Care Teams include your primary Cardiologist (physician) and Advanced Practice Providers (APPs -  Physician Assistants and Nurse Practitioners) who all work together to provide you with the care you need, when you need it.  We recommend signing up for the patient portal called "MyChart".  Sign up information is provided on this After Visit Summary.  MyChart is used to connect with patients for Virtual Visits (Telemedicine).  Patients are able to view lab/test results, encounter notes, upcoming appointments, etc.  Non-urgent messages can be sent to your provider as well.   To learn more about what you can do with MyChart, go to [email or web address].    Your next appointment:   2 month(s)  The format for your next appointment:   In Person  Provider:   Armanda Magic, MD {  Other Instructions   1st Floor: - Lobby - Registration  - Pharmacy  - Lab - Cafe  2nd Floor: - PV Lab - Diagnostic Testing (echo, CT, nuclear med)  3rd Floor: - Vacant  4th Floor: - TCTS (cardiothoracic surgery) - AFib Clinic - Structural Heart Clinic - Vascular Surgery  - Vascular Ultrasound  5th Floor: - HeartCare Cardiology (general and EP) - Clinical Pharmacy for coumadin, hypertension, lipid, weight-loss medications, and med management appointments    Valet parking services will be available as well.

## 2023-12-30 NOTE — Telephone Encounter (Signed)
 Spoke with pt regarding lab results and clarified Torsemide dose. Pt verbalized understanding and had no further questions.

## 2023-12-30 NOTE — Progress Notes (Signed)
 Sleep Medicine CONSULT Note    Date:  12/30/2023   ID:  Walker Shadow, DOB [birth year], MRN [id number]  PCP:  Audie Pinto, FNP  Cardiologist: Gypsy Balsam, MD   Chief Complaint  Patient presents with   New Patient (Initial Visit)    Obstructive sleep apnea    History of Present Illness:  Meagan Doyle is a 58 y.o. female who is being seen today for the evaluation of obstructive sleep apnea at the request of Gypsy Balsam, MD.  This is a 58 year old female with a history of pulmonary embolism, aortic atherosclerosis, CAD, hyperlipidemia, hypertension, PFO who was seen by Dr. Bing Matter in fall 2024 and ordered a home sleep study when she complained to him that she was snoring loudly.  She also would feel tired when she would wake up in the am and would stay sleepy throughout the day.  She does not sleep well at night. Stop Bang score 5-6.    Sleep study  showed mild obstructive sleep apnea with an AHI of 13.5/h overall but moderate during REM sleep with a REM AHI of 18.8/h.  She did have nocturnal hypoxemia with O2 sat nadir 84% and O2 sats less than 88% for 6.2 minutes.  She was started on auto CPAP from 4 to 15 cm H2O.  She is now referred for sleep medicine consultation to establish sleep care and treatment of obstructive sleep apnea  She is doing well with her PAP device and thinks that she has gotten used to it.  She tolerates the full face mask and feels the pressure is adequate.  Unfortunately she has not really noticed an improvement in her daytime sleepiness but does not sleep well at night.  She denies any significant mouth or nasal dryness or nasal congestion.  She goes to bed at 10-11PM and can only sleep a few hours at a time. She has not tried any sleep aides.  She does not think that she snores.    Past Medical History:  Diagnosis Date   Abdominal pain 10/11/2015   Acute pulmonary embolism (HCC) 06/04/2021   Aortic atherosclerosis (HCC) 09/27/2016    Formatting of this note might be different from the original. Seen on chest x-ray in 08/2016.   Bilateral lower extremity edema 06/14/2020   Chronic cough 03/08/2021   Chronic pain 11/25/2019   Coronary artery disease 2024   mild, non-obstructive per cCTA   DDD (degenerative disc disease), lumbar 10/24/2019   Diastolic dysfunction 2024   Dyspnea on exertion 12/29/2019   Gallstone 10/11/2015   GERD (gastroesophageal reflux disease)    Hiatal hernia 08/12/2016   High blood cholesterol    High risk medication use 08/13/2016   History of deep vein thrombosis (DVT) of lower extremity 05/13/2021   History of pulmonary embolism 12/29/2019   HTN (hypertension) 05/01/2020   Hyperlipemia 12/24/2016   Hypertensive crisis 05/01/2020   Iron deficiency anemia 08/12/2016   Lumbar facet arthropathy 10/24/2019   Malaise and fatigue 08/13/2016   Morbid obesity (HCC) 11/25/2019   Obesity (BMI 30-39.9) 08/13/2016   PFO (patent foramen ovale) 2024   Prediabetes 08/12/2016   Primary osteoarthritis of right knee 09/16/2019   Pulmonary embolism (HCC)    Pulmonary embolism and infarction (HCC) 11/25/2019   S/P arthroscopic surgery of right knee 03/13/2020   Tear of medial meniscus of right knee 08/26/2019   Vitamin B12 deficiency 08/21/2016    Past Surgical History:  Procedure Laterality Date   ABDOMINAL HYSTERECTOMY  CHOLECYSTECTOMY     KNEE ARTHROPLASTY      Current Medications: Current Meds  Medication Sig   albuterol (ACCUNEB) 0.63 MG/3ML nebulizer solution Take 3 mLs by nebulization every 4 (four) hours as needed for wheezing or shortness of breath.   amLODipine (NORVASC) 10 MG tablet Take 10 mg by mouth daily.   atorvastatin (LIPITOR) 20 MG tablet Take 60 mg by mouth daily.   hydroxychloroquine (PLAQUENIL) 200 MG tablet Take 400 mg by mouth daily.   LINZESS 145 MCG CAPS capsule Take 145 mcg by mouth daily.   losartan (COZAAR) 50 MG tablet Take 1 tablet (50 mg total) by mouth 2 (two)  times daily.   metoprolol (TOPROL-XL) 200 MG 24 hr tablet Take 1 tablet (200 mg total) by mouth daily.   omeprazole (PRILOSEC OTC) 20 MG tablet Take 20 mg by mouth daily.   omeprazole (PRILOSEC) 20 MG capsule Take 20 mg by mouth every morning.   potassium chloride (KLOR-CON) 10 MEQ tablet Take 1 tablet (10 mEq total) by mouth daily.   rivaroxaban (XARELTO) 20 MG TABS tablet Take 1 tablet (20 mg total) by mouth daily with supper.   torsemide (DEMADEX) 20 MG tablet Take 1 tablet (20 mg total) by mouth 2 (two) times daily.    Allergies:   Patient has no known allergies.   Social History   Socioeconomic History   Marital status: Married    Spouse name: Not on file   Number of children: Not on file   Years of education: Not on file   Highest education level: Not on file  Occupational History   Not on file  Tobacco Use   Smoking status: Former    Current packs/day: 0.00    Average packs/day: 1 pack/day for 15.0 years (15.0 ttl pk-yrs)    Types: Cigarettes    Start date: 98    Quit date: 2003    Years since quitting: 22.2   Smokeless tobacco: Never  Vaping Use   Vaping status: Never Used  Substance and Sexual Activity   Alcohol use: Not Currently   Drug use: Never   Sexual activity: Not on file  Other Topics Concern   Not on file  Social History Narrative   Not on file   Social Drivers of Health   Financial Resource Strain: Not on file  Food Insecurity: Low Risk  (12/17/2023)   Received from [facility]   Hunger Vital Sign    Worried About Running Out of Food in the Last Year: Never true    Ran Out of Food in the Last Year: Never true  Transportation Needs: No Transportation Needs (12/17/2023)   Received from [facility]    In the past 12 months, has lack of reliable transportation kept you from medical appointments, meetings, work or from getting things needed for daily living? : No  Physical Activity: Not on file  Stress: Not on file  Social  Connections: Not on file     Family History:  The patient's family history includes Anuerysm in her father; COPD in her mother; Emphysema in her mother.   ROS:   Please see the history of present illness.    ROS All other systems reviewed and are negative.      No data to display             PHYSICAL EXAM:   VS:  BP 120/82 (BP Location: Left Arm, Patient Position: Sitting, Cuff Size: Large)   Pulse  62   Resp 16   Ht 5\' 8"  (1.727 m)   Wt 276 lb (125.2 kg)   SpO2 96%   BMI 41.97 kg/m    GEN: Well nourished, well developed, in no acute distress  HEENT: normal  Neck: no JVD, carotid bruits, or masses Cardiac: RRR; no murmurs, rubs, or gallops,no edema.  Intact distal pulses bilaterally.  Respiratory:  clear to auscultation bilaterally, normal work of breathing GI: soft, nontender, nondistended, + BS MS: no deformity or atrophy  Skin: warm and dry, no rash Neuro:  Alert and Oriented x 3, Strength and sensation are intact Psych: euthymic mood, full affect  Wt Readings from Last 3 Encounters:  12/30/23 276 lb (125.2 kg)  12/22/23 275 lb (124.7 kg)  07/20/23 267 lb 12.8 oz (121.5 kg)      Studies/Labs Reviewed:   Home sleep study and PAP compliance download  Recent Labs: 03/27/2023: Hemoglobin 12.5; Magnesium 2.0; Platelets 311; TSH 2.010 05/22/2023: ALT 15 12/22/2023: BUN 15; Creatinine, Ser 0.83; NT-Pro BNP 156; Potassium 5.1; Sodium 141     ASSESSMENT:    1. OSA (obstructive sleep apnea)   2. Primary hypertension      PLAN:  In order of problems listed above:  OSA - The patient is tolerating PAP therapy well without any problems. The PAP download performed by his DME was personally reviewed and interpreted by me today and showed an AHI of 0.4 /hr on auto CPAP from 4-15 cm H2O with 47% compliance in using more than 4 hours nightly.  The patient has been using and benefiting from PAP use and will continue to benefit from therapy.  -I encouraged her to be more  compliant with her device>>a lot of the issue is not being able to sleep well -I instructed her to try to take Melatonin OTC -I encouraged her to stop all caffeine.  She does not drink ETOH -encouraged her to avoid TV or any screen time 1 hour before bed  Hypertension -BP controlled on exam today -Continue prescription drug managed with amlodipine 10 mg daily, losartan 50 mg twice daily, Toprol XL 200 mg daily with as needed refills  Followup with me in 2 months  Time Spent: 20 minutes total time of encounter, including 15 minutes spent in face-to-face patient care on the date of this encounter. This time includes coordination of care and counseling regarding above mentioned problem list. Remainder of non-face-to-face time involved reviewing chart documents/testing relevant to the patient encounter and documentation in the medical record. I have independently reviewed documentation from referring provider  Medication Adjustments/Labs and Tests Ordered: Current medicines are reviewed at length with the patient today.  Concerns regarding medicines are outlined above.  Medication changes, Labs and Tests ordered today are listed in the Patient Instructions below.  There are no Patient Instructions on file for this visit.   Signed, Armanda Magic, MD  12/30/2023 12:53 PM    [facility] [address] [place], [place], [place]  [address] Phone: [phone]; Fax: [phone]

## 2023-12-30 NOTE — Addendum Note (Signed)
 Addended by: Franchot Gallo on: 12/30/2023 01:24 PM   Modules accepted: Orders

## 2024-01-28 ENCOUNTER — Telehealth: Payer: Self-pay

## 2024-01-28 NOTE — Telephone Encounter (Signed)
   Pre-operative Risk Assessment    Patient Name: Meagan Doyle  DOB: 05-02-1966 MRN: [id number]  Date of last office visit: 12/30/2023 Date of next office visit: 03/03/2024   Request for Surgical Clearance    Procedure:   Colonoscopy  Date of Surgery:  Clearance TBD                              Surgeon:  Merlene Stark MD Surgeon's Group or Practice Name:  [facility]  Phone number:  [phone] Fax number:  [phone] Type of Clearance Requested:   - Medical  - Pharmacy:  Hold Rivaroxaban  (Xarelto ) hold 2 days prior to procedure  Type of Anesthesia:  Not Indicated   Additional requests/questions:    Signed, [place] C Tarisha Fader   01/28/2024, 11:58 AM

## 2024-02-03 NOTE — Telephone Encounter (Signed)
 Patient with diagnosis of PE/DVT on Xarelto  for anticoagulation.    Procedure:  Colonoscopy  Date of procedure: TBD  Appears patient had PE post surgery and then another PE and DVT while only taking Xarelto  every other day. Hematology workup negative.  CrCl 104 ml/min Platelet count 292  Per office protocol, patient can hold Xarelto  for 2 days prior to procedure.    **This guidance is not considered finalized until pre-operative APP has relayed final recommendations.**

## 2024-02-05 NOTE — Telephone Encounter (Signed)
   Name: Meagan Doyle  DOB: 1966/06/19  MRN: [id number]   Primary Cardiologist: Ralene Burger, MD  Chart reviewed as part of pre-operative protocol coverage. Meagan Doyle was last seen on 12/22/2023 by Dr. Gordan Latina.  Per Dr. Krasowski "I would recommend to skip Xarelto  only for 1 day before procedure and start as quickly as feasible from GI point of view."  Therefore, based on ACC/AHA guidelines, the patient would be an acceptable risk for the planned procedure without further cardiovascular testing.   I will route this recommendation to the requesting party via Epic fax function and remove from pre-op pool. Please call with questions.  Morey Ar, NP 02/05/2024, 8:43 AM

## 2024-03-03 ENCOUNTER — Ambulatory Visit: Admitting: Cardiology

## 2024-04-25 ENCOUNTER — Ambulatory Visit: Attending: Cardiology | Admitting: Cardiology

## 2024-04-25 ENCOUNTER — Encounter: Payer: Self-pay | Admitting: Cardiology

## 2024-04-25 VITALS — BP 110/62 | HR 70 | Ht 68.0 in | Wt 277.6 lb

## 2024-04-25 DIAGNOSIS — I10 Essential (primary) hypertension: Secondary | ICD-10-CM | POA: Diagnosis present

## 2024-04-25 DIAGNOSIS — G4733 Obstructive sleep apnea (adult) (pediatric): Secondary | ICD-10-CM | POA: Diagnosis not present

## 2024-04-25 MED ORDER — MELATONIN 10 MG PO CAPS: 10.0000 mg | ORAL_CAPSULE | Freq: Every evening | ORAL | 3 refills | Status: AC

## 2024-04-25 NOTE — Patient Instructions (Signed)
 Medication Instructions:  Please INCREASE your dose of melatonin to 10 mg at hour of sleep as needed.  *If you need a refill on your cardiac medications before your next appointment, please call your pharmacy*  Lab Work: None.  If you have labs (blood work) drawn today and your tests are completely normal, you will receive your results only by: MyChart Message (if you have MyChart) OR A paper copy in the mail If you have any lab test that is abnormal or we need to change your treatment, we will call you to review the results.  Testing/Procedures: None.  Follow-Up: At [facility], you and your health needs are our priority.  As part of our continuing mission to provide you with exceptional heart care, our providers are all part of one team.  This team includes your primary Cardiologist (physician) and Advanced Practice Providers or APPs (Physician Assistants and Nurse Practitioners) who all work together to provide you with the care you need, when you need it.  Your next appointment:   1 year(s)  Provider:   Dr. Wilbert Bihari, MD  We recommend signing up for the patient portal called MyChart.  Sign up information is provided on this After Visit Summary.  MyChart is used to connect with patients for Virtual Visits (Telemedicine).  Patients are able to view lab/test results, encounter notes, upcoming appointments, etc.  Non-urgent messages can be sent to your provider as well.   To learn more about what you can do with MyChart, go to [email or web address].   Other Instructions Dr. Bihari will obtain a download from  your cpap machine in 4 weeks and will contact you if any changes need to be made to your treatment plan.

## 2024-04-25 NOTE — Progress Notes (Signed)
 Sleep Medicine Note    Date:  04/25/2024   ID:  Meagan Doyle, DOB [birth year], MRN [id number]  PCP:  Jackolyn Darice BROCKS, FNP  Cardiologist: Lamar Fitch, MD   Chief Complaint  Patient presents with   Sleep Apnea   Hypertension    History of Present Illness:  Meagan Doyle is a 58 y.o. female with a history of pulmonary embolism, aortic atherosclerosis, CAD, hyperlipidemia, hypertension, PFO who was seen by Dr. Fitch in fall 2024 and ordered a home sleep study when she complained to him that she was snoring loudly.  She also would feel tired when she would wake up in the am and would stay sleepy throughout the day.  She does not sleep well at night. Stop Bang score 5-6.    Sleep study  showed mild obstructive sleep apnea with an AHI of 13.5/h overall but moderate during REM sleep with a REM AHI of 18.8/h.  She did have nocturnal hypoxemia with O2 sat nadir 84% and O2 sats less than 88% for 6.2 minutes.  She was started on auto CPAP from 4 to 15 cm H2O.    At her last office visit I reviewed the download which showed an AHI of 0.4 /hr on auto CPAP from 4-15 cm H2O with 47% compliance in using more than 4 hours nightly.  I encouraged her to increase compliance with her CPAP.  I encouraged her also to avoid caffeine and alcohol and exercise prior to sleep.  We reviewed good sleep hygiene to hopefully help her sleep more effectively at night.  She was having problems with sleeping so I encouraged her to try over-the-counter melatonin.  She is now back for follow-up.  She had been having a lot of GERD issues recently and had not been able to use her device.  She just started back on her PAP a few weeks ago.   She tolerates the mask and feels the pressure is adequate.  She goes to bed at 10-11pm and get up at 5-6am.  She tried the melatonin 5mg  at bedtime PRN but did not really help much. She still drinks some caffeine but no ETOH.  She denies any significant mouth or nasal dryness  or nasal congestion.  She does not think that he snores.    Past Medical History:  Diagnosis Date   Abdominal pain 10/11/2015   Acute pulmonary embolism (HCC) 06/04/2021   Aortic atherosclerosis (HCC) 09/27/2016   Formatting of this note might be different from the original. Seen on chest x-ray in 08/2016.   Bilateral lower extremity edema 06/14/2020   Chronic cough 03/08/2021   Chronic pain 11/25/2019   Coronary artery disease 2024   mild, non-obstructive per cCTA   DDD (degenerative disc disease), lumbar 10/24/2019   Diastolic dysfunction 2024   Dyspnea on exertion 12/29/2019   Gallstone 10/11/2015   GERD (gastroesophageal reflux disease)    Hiatal hernia 08/12/2016   High blood cholesterol    High risk medication use 08/13/2016   History of deep vein thrombosis (DVT) of lower extremity 05/13/2021   History of pulmonary embolism 12/29/2019   HTN (hypertension) 05/01/2020   Hyperlipemia 12/24/2016   Hypertensive crisis 05/01/2020   Iron deficiency anemia 08/12/2016   Lumbar facet arthropathy 10/24/2019   Malaise and fatigue 08/13/2016   Morbid obesity (HCC) 11/25/2019   Obesity (BMI 30-39.9) 08/13/2016   PFO (patent foramen ovale) 2024   Prediabetes 08/12/2016   Primary osteoarthritis of right knee 09/16/2019  Pulmonary embolism (HCC)    Pulmonary embolism and infarction (HCC) 11/25/2019   S/P arthroscopic surgery of right knee 03/13/2020   Tear of medial meniscus of right knee 08/26/2019   Vitamin B12 deficiency 08/21/2016    Past Surgical History:  Procedure Laterality Date   ABDOMINAL HYSTERECTOMY     CHOLECYSTECTOMY     KNEE ARTHROPLASTY      Current Medications: Current Meds  Medication Sig   acetaminophen -codeine (TYLENOL  #3) 300-30 MG tablet Take 1-2 tablets by mouth as needed.   albuterol  (ACCUNEB ) 0.63 MG/3ML nebulizer solution Take 3 mLs by nebulization every 4 (four) hours as needed for wheezing or shortness of breath.   amLODipine  (NORVASC ) 10 MG  tablet Take 10 mg by mouth daily.   atorvastatin  (LIPITOR) 20 MG tablet Take 60 mg by mouth daily.   hydroxychloroquine  (PLAQUENIL ) 200 MG tablet Take 400 mg by mouth daily.   LINZESS 145 MCG CAPS capsule Take 145 mcg by mouth daily.   losartan  (COZAAR ) 50 MG tablet Take 1 tablet (50 mg total) by mouth 2 (two) times daily.   metoprolol  (TOPROL -XL) 200 MG 24 hr tablet Take 1 tablet (200 mg total) by mouth daily.   omeprazole  (PRILOSEC  OTC) 20 MG tablet Take 20 mg by mouth daily. (Patient taking differently: Take 20 mg by mouth in the morning and at bedtime.)   potassium chloride  (KLOR-CON ) 10 MEQ tablet Take 1 tablet (10 mEq total) by mouth daily.   rivaroxaban  (XARELTO ) 20 MG TABS tablet Take 1 tablet (20 mg total) by mouth daily with supper.   torsemide  (DEMADEX ) 20 MG tablet Take 1 tablet (20 mg total) by mouth 2 (two) times daily.    Allergies:   Patient has no known allergies.   Social History   Socioeconomic History   Marital status: Married    Spouse name: Not on file   Number of children: Not on file   Years of education: Not on file   Highest education level: Not on file  Occupational History   Not on file  Tobacco Use   Smoking status: Former    Current packs/day: 0.00    Average packs/day: 1 pack/day for 15.0 years (15.0 ttl pk-yrs)    Types: Cigarettes    Start date: 32    Quit date: 2003    Years since quitting: 22.5   Smokeless tobacco: Never  Vaping Use   Vaping status: Never Used  Substance and Sexual Activity   Alcohol use: Not Currently   Drug use: Never   Sexual activity: Not on file  Other Topics Concern   Not on file  Social History Narrative   Not on file   Social Drivers of Health   Financial Resource Strain: Not on file  Food Insecurity: Low Risk  (12/17/2023)   Received from [facility]   Hunger Vital Sign    Within the past 12 months, you worried that your food would run out before you got money to buy more: Never true    Within the past  12 months, the food you bought just didn't last and you didn't have money to get more. : Never true  Transportation Needs: No Transportation Needs (12/17/2023)   Received from [facility]    In the past 12 months, has lack of reliable transportation kept you from medical appointments, meetings, work or from getting things needed for daily living? : No  Physical Activity: Not on file  Stress: Not on file  Social  Connections: Not on file     Family History:  The patient's family history includes Anuerysm in her father; COPD in her mother; Emphysema in her mother.   ROS:   Please see the history of present illness.    ROS All other systems reviewed and are negative.      No data to display             PHYSICAL EXAM:   VS:  BP 110/62 (BP Location: Left Arm, Patient Position: Sitting, Cuff Size: Large)   Pulse 70   Ht 5' 8 (1.727 m)   Wt 277 lb 9.6 oz (125.9 kg)   SpO2 97%   BMI 42.21 kg/m    GEN: Well nourished, well developed in no acute distress HEENT: Normal NECK: No JVD; No carotid bruits LYMPHATICS: No lymphadenopathy CARDIAC:RRR, no murmurs, rubs, gallops RESPIRATORY:  Clear to auscultation without rales, wheezing or rhonchi  ABDOMEN: Soft, non-tender, non-distended MUSCULOSKELETAL:  No edema; No deformity  SKIN: Warm and dry NEUROLOGIC:  Alert and oriented x 3 PSYCHIATRIC:  Normal affect  Wt Readings from Last 3 Encounters:  04/25/24 277 lb 9.6 oz (125.9 kg)  12/30/23 276 lb (125.2 kg)  12/22/23 275 lb (124.7 kg)      Studies/Labs Reviewed:   Home sleep study and PAP compliance download  Recent Labs: 05/22/2023: ALT 15 12/22/2023: BUN 15; Creatinine, Ser 0.83; NT-Pro BNP 156; Potassium 5.1; Sodium 141     ASSESSMENT:    1. OSA (obstructive sleep apnea)   2. Primary hypertension       PLAN:  In order of problems listed above:  OSA - The patient is tolerating PAP therapy well without any problems. The PAP download performed by  his DME was personally reviewed and interpreted by me today and showed an AHI of 0.1 /hr on auto CPAP from 4-15 cm H2O with 37 % compliance in using more than 4 hours nightly.  The patient has been using and benefiting from PAP use and will continue to benefit from therapy.  - Again I stressed the importance of being compliant or she will have to turn her device and per insurance requirements>> she was sick recently and could not use her CPAP for a few weeks but is now back on it - I will check a download in 4 weeks. - she is still having problems with insomnia both onset and maintenance - I encouraged her to try to increase the Melatonin to 10mg  at bedtime PRN to see if she can initiate sleep earlier and maintain sleep.   Hypertension - BP is adequately controlled on exam today - Continue amlodipine  10 mg daily, losartan  50 mg twice daily, Toprol  XL 200 mg daily with as needed refills  Followup with me in 1 year  Time Spent: 20 minutes total time of encounter, including 15 minutes spent in face-to-face patient care on the date of this encounter. This time includes coordination of care and counseling regarding above mentioned problem list. Remainder of non-face-to-face time involved reviewing chart documents/testing relevant to the patient encounter and documentation in the medical record. I have independently reviewed documentation from referring provider  Medication Adjustments/Labs and Tests Ordered: Current medicines are reviewed at length with the patient today.  Concerns regarding medicines are outlined above.  Medication changes, Labs and Tests ordered today are listed in the Patient Instructions below.  There are no Patient Instructions on file for this visit.   Signed, Wilbert Bihari, MD  04/25/2024 2:47 PM  [facility] [address] [place], [place], [place]  [address] Phone: [phone]; Fax: [phone]

## 2024-04-25 NOTE — Addendum Note (Signed)
 Addended by: JANIT GENI CROME on: 04/25/2024 02:58 PM   Modules accepted: Orders

## 2024-05-28 ENCOUNTER — Other Ambulatory Visit: Payer: Self-pay | Admitting: Cardiology

## 2024-06-02 ENCOUNTER — Ambulatory Visit: Attending: Cardiology | Admitting: Cardiology

## 2024-06-02 ENCOUNTER — Encounter: Payer: Self-pay | Admitting: Cardiology

## 2024-06-02 VITALS — BP 140/102 | HR 82 | Ht 68.0 in | Wt 276.0 lb

## 2024-06-02 DIAGNOSIS — Z86718 Personal history of other venous thrombosis and embolism: Secondary | ICD-10-CM | POA: Diagnosis present

## 2024-06-02 DIAGNOSIS — Z86711 Personal history of pulmonary embolism: Secondary | ICD-10-CM | POA: Insufficient documentation

## 2024-06-02 DIAGNOSIS — R0609 Other forms of dyspnea: Secondary | ICD-10-CM | POA: Insufficient documentation

## 2024-06-02 DIAGNOSIS — I5032 Chronic diastolic (congestive) heart failure: Secondary | ICD-10-CM | POA: Diagnosis present

## 2024-06-02 DIAGNOSIS — R5383 Other fatigue: Secondary | ICD-10-CM | POA: Diagnosis present

## 2024-06-02 DIAGNOSIS — I10 Essential (primary) hypertension: Secondary | ICD-10-CM | POA: Diagnosis present

## 2024-06-02 NOTE — Addendum Note (Signed)
 Addended by: ARLOA PLANAS D on: 06/02/2024 02:22 PM   Modules accepted: Orders

## 2024-06-02 NOTE — Progress Notes (Signed)
 Cardiology Office Note:    Date:  06/02/2024   ID:  Meagan Doyle, DOB 1965/11/07, MRN [id number]  PCP:  Jackolyn Darice BROCKS, FNP  Cardiologist:  Lamar Fitch, MD    Referring MD: Jackolyn Darice BROCKS, FNP   Chief Complaint  Patient presents with   Follow-up    History of Present Illness:    Meagan Doyle is a 58 y.o. female  with past medical history significant pulmonary emboli and DVT.  Initial insult was after she got cholecystectomy done then later she got the surgery and again end up having pulmonary emboli.  Also at that time pulmonary infarct.  At that time she did have right ventricular strain pattern with pulmonary pressure 44 mmHg she was transferred to [facility] for more advanced therapy however that she did not require this and she did quite well After that she did well however because of financial issues she was taking Xarelto  I will only every other day and eventually end up having another episode of DVT and PE she did have going to the hospital treated appropriately and call recovering quite nicely.  She also did see hematologist for hypercoagulopathy work-up which was negative.  Comes today 2 months for follow-up overall complaining of feeling weak tired fatigue.  No chest pain tightness squeezing pressure burning chest still complaining of having heavy swellings  Past Medical History:  Diagnosis Date   Abdominal pain 10/11/2015   Acute pulmonary embolism (HCC) 06/04/2021   Aortic atherosclerosis (HCC) 09/27/2016   Formatting of this note might be different from the original. Seen on chest x-ray in 08/2016.   Bilateral lower extremity edema 06/14/2020   Chronic cough 03/08/2021   Chronic pain 11/25/2019   Coronary artery disease 2024   mild, non-obstructive per cCTA   DDD (degenerative disc disease), lumbar 10/24/2019   Diastolic dysfunction 2024   Dyspnea on exertion 12/29/2019   Gallstone 10/11/2015   GERD (gastroesophageal reflux disease)    Hiatal hernia  08/12/2016   High blood cholesterol    High risk medication use 08/13/2016   History of deep vein thrombosis (DVT) of lower extremity 05/13/2021   History of pulmonary embolism 12/29/2019   HTN (hypertension) 05/01/2020   Hyperlipemia 12/24/2016   Hypertensive crisis 05/01/2020   Iron deficiency anemia 08/12/2016   Lumbar facet arthropathy 10/24/2019   Malaise and fatigue 08/13/2016   Morbid obesity (HCC) 11/25/2019   Obesity (BMI 30-39.9) 08/13/2016   PFO (patent foramen ovale) 2024   Prediabetes 08/12/2016   Primary osteoarthritis of right knee 09/16/2019   Pulmonary embolism (HCC)    Pulmonary embolism and infarction (HCC) 11/25/2019   S/P arthroscopic surgery of right knee 03/13/2020   Tear of medial meniscus of right knee 08/26/2019   Vitamin B12 deficiency 08/21/2016    Past Surgical History:  Procedure Laterality Date   ABDOMINAL HYSTERECTOMY     CHOLECYSTECTOMY     KNEE ARTHROPLASTY      Current Medications: Current Meds  Medication Sig   acetaminophen -codeine (TYLENOL  #3) 300-30 MG tablet Take 1-2 tablets by mouth as needed.   albuterol  (ACCUNEB ) 0.63 MG/3ML nebulizer solution Take 3 mLs by nebulization every 4 (four) hours as needed for wheezing or shortness of breath.   amLODipine  (NORVASC ) 10 MG tablet Take 10 mg by mouth daily.   atorvastatin  (LIPITOR) 20 MG tablet Take 60 mg by mouth daily.   hydroxychloroquine  (PLAQUENIL ) 200 MG tablet Take 400 mg by mouth daily.   LINZESS 145 MCG CAPS capsule Take  145 mcg by mouth daily.   losartan  (COZAAR ) 50 MG tablet Take 1 tablet (50 mg total) by mouth 2 (two) times daily.   Melatonin 10 MG CAPS Take 10 mg by mouth at bedtime. As needed at bedtime.   metoprolol  (TOPROL -XL) 200 MG 24 hr tablet Take 1 tablet (200 mg total) by mouth daily.   omeprazole  (PRILOSEC ) 20 MG capsule Take 20 mg by mouth every morning.   potassium chloride  (KLOR-CON ) 10 MEQ tablet Take 1 tablet (10 mEq total) by mouth daily.   rivaroxaban   (XARELTO ) 20 MG TABS tablet Take 1 tablet (20 mg total) by mouth daily with supper.   torsemide  (DEMADEX ) 20 MG tablet Take 1 tablet (20 mg total) by mouth 2 (two) times daily.     Allergies:   Patient has no known allergies.   Social History   Socioeconomic History   Marital status: Married    Spouse name: Not on file   Number of children: Not on file   Years of education: Not on file   Highest education level: Not on file  Occupational History   Not on file  Tobacco Use   Smoking status: Former    Current packs/day: 0.00    Average packs/day: 1 pack/day for 15.0 years (15.0 ttl pk-yrs)    Types: Cigarettes    Start date: 33    Quit date: 2003    Years since quitting: 22.6   Smokeless tobacco: Never  Vaping Use   Vaping status: Never Used  Substance and Sexual Activity   Alcohol use: Not Currently   Drug use: Never   Sexual activity: Not on file  Other Topics Concern   Not on file  Social History Narrative   Not on file   Social Drivers of Health   Financial Resource Strain: Not on file  Food Insecurity: Low Risk  (12/17/2023)   Received from [facility]   Hunger Vital Sign    Within the past 12 months, you worried that your food would run out before you got money to buy more: Never true    Within the past 12 months, the food you bought just didn't last and you didn't have money to get more. : Never true  Transportation Needs: No Transportation Needs (12/17/2023)   Received from [facility]    In the past 12 months, has lack of reliable transportation kept you from medical appointments, meetings, work or from getting things needed for daily living? : No  Physical Activity: Not on file  Stress: Not on file  Social Connections: Not on file     Family History: The patient's family history includes Anuerysm in her father; COPD in her mother; Emphysema in her mother. There is no history of Clotting disorder. ROS:   Please see the history of  present illness.    All 14 point review of systems negative except as described per history of present illness  EKGs/Labs/Other Studies Reviewed:         Recent Labs: 12/22/2023: BUN 15; Creatinine, Ser 0.83; NT-Pro BNP 156; Potassium 5.1; Sodium 141  Recent Lipid Panel    Component Value Date/Time   CHOL 151 05/22/2023 0849   TRIG 86 05/22/2023 0849   HDL 54 05/22/2023 0849   CHOLHDL 2.8 05/22/2023 0849   CHOLHDL 3.6 11/27/2019 0216   VLDL 18 11/27/2019 0216   LDLCALC 81 05/22/2023 0849   LDLDIRECT 113 (H) 09/20/2021 1444    Physical Exam:    VS:  BP (!) 140/102   Pulse 82   Ht 5' 8 (1.727 m)   Wt 276 lb (125.2 kg)   SpO2 95%   BMI 41.97 kg/m     Wt Readings from Last 3 Encounters:  06/02/24 276 lb (125.2 kg)  04/25/24 277 lb 9.6 oz (125.9 kg)  12/30/23 276 lb (125.2 kg)     GEN:  Well nourished, well developed in no acute distress HEENT: Normal NECK: No JVD; No carotid bruits LYMPHATICS: No lymphadenopathy CARDIAC: RRR, no murmurs, no rubs, no gallops RESPIRATORY:  Clear to auscultation without rales, wheezing or rhonchi  ABDOMEN: Soft, non-tender, non-distended MUSCULOSKELETAL:  No edema; No deformity  SKIN: Warm and dry LOWER EXTREMITIES: no swelling NEUROLOGIC:  Alert and oriented x 3 PSYCHIATRIC:  Normal affect   ASSESSMENT:    1. History of deep vein thrombosis (DVT) of lower extremity   2. History of pulmonary embolism   3. Morbid obesity (HCC)   4. Primary hypertension   5. Chronic diastolic (congestive) heart failure (HCC)    PLAN:    In order of problems listed above:  History of DVT, post DVT syndrome.  Ask her to wear elastic stockings I recommended to have stockings measured specifically for her. Fatigue tiredness shortness of breath.  Will schedule her to have echocardiogram to make sure left ventricular ejection fraction is normal with pulmonary pressures normal. Essential hypertension blood pressure elevated today.  However she said  she checked it at home usually good. Anticoagulation will continue. Unexplained weight gain.  Will check TSH today   Medication Adjustments/Labs and Tests Ordered: Current medicines are reviewed at length with the patient today.  Concerns regarding medicines are outlined above.  No orders of the defined types were placed in this encounter.  Medication changes: No orders of the defined types were placed in this encounter.   Signed, Lamar DOROTHA Fitch, MD, [facility] 06/02/2024 2:16 PM    [facility]

## 2024-06-02 NOTE — Patient Instructions (Signed)
 Medication Instructions:  Your physician recommends that you continue on your current medications as directed. Please refer to the Current Medication list given to you today.  *If you need a refill on your cardiac medications before your next appointment, please call your pharmacy*   Lab Work: BMP, ProBNP, TSH- today If you have labs (blood work) drawn today and your tests are completely normal, you will receive your results only by: MyChart Message (if you have MyChart) OR A paper copy in the mail If you have any lab test that is abnormal or we need to change your treatment, we will call you to review the results.   Testing/Procedures: Your physician has requested that you have an echocardiogram. Echocardiography is a painless test that uses sound waves to create images of your heart. It provides your doctor with information about the size and shape of your heart and how well your heart's chambers and valves are working. This procedure takes approximately one hour. There are no restrictions for this procedure. Please do NOT wear cologne, perfume, aftershave, or lotions (deodorant is allowed). Please arrive 15 minutes prior to your appointment time.  Please note: We ask at that you not bring children with you during ultrasound (echo/ vascular) testing. Due to room size and safety concerns, children are not allowed in the ultrasound rooms during exams. Our front office staff cannot provide observation of children in our lobby area while testing is being conducted. An adult accompanying a patient to their appointment will only be allowed in the ultrasound room at the discretion of the ultrasound technician under special circumstances. We apologize for any inconvenience.    Follow-Up: At [facility], you and your health needs are our priority.  As part of our continuing mission to provide you with exceptional heart care, we have created designated Provider Care Teams.  These Care Teams include  your primary Cardiologist (physician) and Advanced Practice Providers (APPs -  Physician Assistants and Nurse Practitioners) who all work together to provide you with the care you need, when you need it.  We recommend signing up for the patient portal called MyChart.  Sign up information is provided on this After Visit Summary.  MyChart is used to connect with patients for Virtual Visits (Telemedicine).  Patients are able to view lab/test results, encounter notes, upcoming appointments, etc.  Non-urgent messages can be sent to your provider as well.   To learn more about what you can do with MyChart, go to [email or web address].    Your next appointment:   6 month(s)  The format for your next appointment:   In Person  Provider:   Lamar Fitch, MD    Other Instructions NA

## 2024-06-03 LAB — BASIC METABOLIC PANEL WITH GFR
BUN/Creatinine Ratio: 11 (ref 9–23)
BUN: 8 mg/dL (ref 6–24)
CO2: 21 mmol/L (ref 20–29)
Calcium: 9.2 mg/dL (ref 8.7–10.2)
Chloride: 106 mmol/L (ref 96–106)
Creatinine, Ser: 0.73 mg/dL (ref 0.57–1.00)
Glucose: 98 mg/dL (ref 70–99)
Potassium: 3.6 mmol/L (ref 3.5–5.2)
Sodium: 143 mmol/L (ref 134–144)
eGFR: 96 mL/min/1.73 (ref >59)

## 2024-06-03 LAB — PRO B NATRIURETIC PEPTIDE: NT-Pro BNP: 218 pg/mL (ref 0–287)

## 2024-06-03 LAB — TSH: TSH: 1.88 u[IU]/mL (ref 0.450–4.500)

## 2024-06-07 ENCOUNTER — Telehealth: Payer: Self-pay

## 2024-06-07 ENCOUNTER — Ambulatory Visit: Payer: Self-pay | Admitting: Cardiology

## 2024-06-07 NOTE — Telephone Encounter (Signed)
 Left message on My Chart with lab results per Dr. Karry note. Routed to PCP

## 2024-06-08 ENCOUNTER — Telehealth: Payer: Self-pay

## 2024-06-08 NOTE — Telephone Encounter (Signed)
 Pt viewed lab results on My Chart per Dr. Karry note. Routed to PCP.

## 2024-06-12 ENCOUNTER — Other Ambulatory Visit: Payer: Self-pay | Admitting: Cardiology

## 2024-06-13 NOTE — Telephone Encounter (Signed)
 Prescription refill request for Xarelto  received.  Indication: DVT Last office visit: 9/25 Bernie Weight: 125.5 Age: 58 Scr: 0.73 CrCl: > 100

## 2024-06-14 NOTE — Telephone Encounter (Signed)
**Note De-identified Onelia Cadmus Obfuscation** -----  **Note De-Identified Safia Panzer Obfuscation** Message from Nurse Geni E sent at 04/25/2024  3:02 PM EDT ----- Regarding: 4 week download Dr. Shlomo requests a download in 4 weeks on this patient, thanks! Geni, RN

## 2024-06-14 NOTE — Telephone Encounter (Signed)
**Note De-Identified Meagan Doyle Obfuscation** Compliance Report as requested per Dr Shlomo:

## 2024-06-28 ENCOUNTER — Ambulatory Visit: Attending: Cardiology

## 2024-06-28 DIAGNOSIS — R0609 Other forms of dyspnea: Secondary | ICD-10-CM | POA: Insufficient documentation

## 2024-06-28 LAB — ECHOCARDIOGRAM COMPLETE
Area-P 1/2: 4.15 cm2
MV M vel: 4.44 m/s
MV Peak grad: 78.9 mmHg
Radius: 0.5 cm
S' Lateral: 3.2 cm

## 2024-07-11 ENCOUNTER — Telehealth: Payer: Self-pay

## 2024-07-11 NOTE — Telephone Encounter (Signed)
 Left message on My Chart with Echo results per Dr. Karry note. Routed to PCP.

## 2024-07-12 ENCOUNTER — Telehealth: Payer: Self-pay

## 2024-07-12 NOTE — Telephone Encounter (Signed)
 Pt viewed Echo results on My Chart per Dr. Vanetta Shawl note. Routed to PCP.

## 2024-09-27 ENCOUNTER — Other Ambulatory Visit: Payer: Self-pay | Admitting: Cardiology

## 2024-09-27 DIAGNOSIS — I10 Essential (primary) hypertension: Secondary | ICD-10-CM

## 2024-09-28 ENCOUNTER — Other Ambulatory Visit: Payer: Self-pay

## 2024-09-28 MED ORDER — ATORVASTATIN CALCIUM 40 MG PO TABS: 60.0000 mg | ORAL_TABLET | Freq: Every day | ORAL | 2 refills | Status: AC

## 2024-09-28 MED ORDER — AMLODIPINE BESYLATE 10 MG PO TABS: 10.0000 mg | ORAL_TABLET | Freq: Every day | ORAL | 2 refills | Status: AC

## 2024-11-30 ENCOUNTER — Ambulatory Visit: Admitting: Cardiology
